# Patient Record
Sex: Male | Born: 1965 | Race: White | Hispanic: No | Marital: Single | Smoking: Current every day smoker
Health system: Southern US, Community
[De-identification: ages and names within clinical notes are randomized; demographics above are authoritative.]

## PROBLEM LIST (undated history)

## (undated) HISTORY — PX: TONSILLECTOMY: SUR1361

## (undated) HISTORY — PX: LEG SURGERY: SHX1003

---

## 2015-07-13 ENCOUNTER — Encounter (HOSPITAL_COMMUNITY): Payer: Self-pay | Admitting: *Deleted

## 2015-07-13 ENCOUNTER — Emergency Department (HOSPITAL_COMMUNITY)
Admission: EM | Admit: 2015-07-13 | Discharge: 2015-07-13 | Disposition: A | Discharge status: Home / Self Care | Payer: Self-pay | Attending: Emergency Medicine | Admitting: Emergency Medicine

## 2015-07-13 DIAGNOSIS — F17210 Nicotine dependence, cigarettes, uncomplicated: Secondary | ICD-10-CM | POA: Insufficient documentation

## 2015-07-13 DIAGNOSIS — W228XXA Striking against or struck by other objects, initial encounter: Secondary | ICD-10-CM | POA: Insufficient documentation

## 2015-07-13 DIAGNOSIS — L0390 Cellulitis, unspecified: Secondary | ICD-10-CM | POA: Insufficient documentation

## 2015-07-13 DIAGNOSIS — S8992XA Unspecified injury of left lower leg, initial encounter: Secondary | ICD-10-CM | POA: Insufficient documentation

## 2015-07-13 DIAGNOSIS — Y9389 Activity, other specified: Secondary | ICD-10-CM | POA: Insufficient documentation

## 2015-07-13 DIAGNOSIS — Y9289 Other specified places as the place of occurrence of the external cause: Secondary | ICD-10-CM | POA: Insufficient documentation

## 2015-07-13 DIAGNOSIS — Y998 Other external cause status: Secondary | ICD-10-CM | POA: Insufficient documentation

## 2015-07-13 NOTE — ED Notes (Signed)
No answer when called to patient room 

## 2015-07-13 NOTE — ED Notes (Signed)
No answer when called from waiting room

## 2015-07-13 NOTE — ED Notes (Signed)
Pt states he walked into a water spigot hitting his left shin. Area is reddened with drainage coming from the center.

## 2016-02-05 ENCOUNTER — Observation Stay (HOSPITAL_COMMUNITY)
Admission: EM | Admit: 2016-02-05 | Discharge: 2016-02-06 | Disposition: A | Discharge status: Home / Self Care | Payer: Self-pay | Attending: Internal Medicine | Admitting: Internal Medicine

## 2016-02-05 ENCOUNTER — Emergency Department (HOSPITAL_COMMUNITY): Payer: Self-pay

## 2016-02-05 ENCOUNTER — Encounter (HOSPITAL_COMMUNITY): Payer: Self-pay | Admitting: *Deleted

## 2016-02-05 DIAGNOSIS — D72829 Elevated white blood cell count, unspecified: Secondary | ICD-10-CM | POA: Diagnosis present

## 2016-02-05 DIAGNOSIS — D473 Essential (hemorrhagic) thrombocythemia: Secondary | ICD-10-CM

## 2016-02-05 DIAGNOSIS — D75839 Thrombocytosis, unspecified: Secondary | ICD-10-CM | POA: Diagnosis present

## 2016-02-05 DIAGNOSIS — F17210 Nicotine dependence, cigarettes, uncomplicated: Secondary | ICD-10-CM | POA: Insufficient documentation

## 2016-02-05 DIAGNOSIS — Z79899 Other long term (current) drug therapy: Secondary | ICD-10-CM | POA: Insufficient documentation

## 2016-02-05 DIAGNOSIS — R079 Chest pain, unspecified: Principal | ICD-10-CM | POA: Diagnosis present

## 2016-02-05 DIAGNOSIS — F17200 Nicotine dependence, unspecified, uncomplicated: Secondary | ICD-10-CM | POA: Diagnosis present

## 2016-02-05 DIAGNOSIS — F1910 Other psychoactive substance abuse, uncomplicated: Secondary | ICD-10-CM | POA: Diagnosis present

## 2016-02-05 DIAGNOSIS — R0602 Shortness of breath: Secondary | ICD-10-CM | POA: Insufficient documentation

## 2016-02-05 DIAGNOSIS — F1010 Alcohol abuse, uncomplicated: Secondary | ICD-10-CM | POA: Diagnosis present

## 2016-02-05 LAB — CBC WITH DIFFERENTIAL/PLATELET
Basophils Absolute: 0 10*3/uL (ref 0.0–0.1)
Basophils Relative: 0 %
EOS PCT: 3 %
Eosinophils Absolute: 0.4 10*3/uL (ref 0.0–0.7)
HEMATOCRIT: 48.2 % (ref 39.0–52.0)
HEMOGLOBIN: 16.4 g/dL (ref 13.0–17.0)
LYMPHS ABS: 2.5 10*3/uL (ref 0.7–4.0)
LYMPHS PCT: 23 %
MCH: 28.9 pg (ref 26.0–34.0)
MCHC: 34 g/dL (ref 30.0–36.0)
MCV: 85 fL (ref 78.0–100.0)
Monocytes Absolute: 1.2 10*3/uL — ABNORMAL HIGH (ref 0.1–1.0)
Monocytes Relative: 10 %
NEUTROS ABS: 7.1 10*3/uL (ref 1.7–7.7)
NEUTROS PCT: 64 %
Platelets: 401 10*3/uL — ABNORMAL HIGH (ref 150–400)
RBC: 5.67 MIL/uL (ref 4.22–5.81)
RDW: 12.2 % (ref 11.5–15.5)
WBC: 11.2 10*3/uL — AB (ref 4.0–10.5)

## 2016-02-05 LAB — RAPID URINE DRUG SCREEN, HOSP PERFORMED
AMPHETAMINES: POSITIVE — AB
BARBITURATES: NOT DETECTED
Benzodiazepines: NOT DETECTED
Cocaine: NOT DETECTED
Opiates: NOT DETECTED
TETRAHYDROCANNABINOL: NOT DETECTED

## 2016-02-05 LAB — BASIC METABOLIC PANEL
Anion gap: 12 (ref 5–15)
BUN: 14 mg/dL (ref 6–20)
CHLORIDE: 99 mmol/L — AB (ref 101–111)
CO2: 25 mmol/L (ref 22–32)
CREATININE: 0.9 mg/dL (ref 0.61–1.24)
Calcium: 9.8 mg/dL (ref 8.9–10.3)
GFR calc non Af Amer: >60 mL/min (ref >60)
Glucose, Bld: 79 mg/dL (ref 65–99)
POTASSIUM: 3.5 mmol/L (ref 3.5–5.1)
Sodium: 136 mmol/L (ref 135–145)

## 2016-02-05 LAB — TROPONIN I
Troponin I: <0.03 ng/mL (ref <0.03)
Troponin I: <0.03 ng/mL (ref <0.03)

## 2016-02-05 LAB — ETHANOL

## 2016-02-05 LAB — BRAIN NATRIURETIC PEPTIDE: B Natriuretic Peptide: 11 pg/mL (ref 0.0–100.0)

## 2016-02-05 MED ORDER — ENOXAPARIN SODIUM 40 MG/0.4ML ~~LOC~~ SOLN
40.0000 mg | SUBCUTANEOUS | Status: DC
  Administered 2016-02-05: 40 mg via SUBCUTANEOUS
  Filled 2016-02-05: qty 0.4

## 2016-02-05 MED ORDER — LORAZEPAM 2 MG/ML IJ SOLN
1.0000 mg | Freq: Four times a day (QID) | INTRAMUSCULAR | Status: DC | PRN
  Administered 2016-02-05: 1 mg via INTRAVENOUS
  Filled 2016-02-05: qty 1

## 2016-02-05 MED ORDER — FOLIC ACID 1 MG PO TABS
1.0000 mg | ORAL_TABLET | Freq: Every day | ORAL | Status: DC
  Administered 2016-02-05 – 2016-02-06 (×2): 1 mg via ORAL
  Filled 2016-02-05 (×2): qty 1

## 2016-02-05 MED ORDER — KETOROLAC TROMETHAMINE 30 MG/ML IJ SOLN: 30.0000 mg | Freq: Four times a day (QID) | INTRAMUSCULAR | Status: DC | PRN

## 2016-02-05 MED ORDER — NITROGLYCERIN 0.4 MG SL SUBL: 0.4000 mg | SUBLINGUAL_TABLET | SUBLINGUAL | Status: DC | PRN

## 2016-02-05 MED ORDER — LORAZEPAM 2 MG/ML IJ SOLN: 0.0000 mg | Freq: Four times a day (QID) | INTRAMUSCULAR | Status: DC

## 2016-02-05 MED ORDER — ADULT MULTIVITAMIN W/MINERALS CH
1.0000 | ORAL_TABLET | Freq: Every day | ORAL | Status: DC
  Administered 2016-02-05 – 2016-02-06 (×2): 1 via ORAL
  Filled 2016-02-05 (×2): qty 1

## 2016-02-05 MED ORDER — ONDANSETRON HCL 4 MG/2ML IJ SOLN: 4.0000 mg | Freq: Four times a day (QID) | INTRAMUSCULAR | Status: DC | PRN

## 2016-02-05 MED ORDER — SODIUM CHLORIDE 0.9 % IV BOLUS (SEPSIS)
500.0000 mL | Freq: Once | INTRAVENOUS | Status: AC
  Administered 2016-02-05: 500 mL via INTRAVENOUS

## 2016-02-05 MED ORDER — LORAZEPAM 2 MG/ML IJ SOLN: 0.0000 mg | Freq: Two times a day (BID) | INTRAMUSCULAR | Status: DC

## 2016-02-05 MED ORDER — LORAZEPAM 1 MG PO TABS: 1.0000 mg | ORAL_TABLET | Freq: Four times a day (QID) | ORAL | Status: DC | PRN

## 2016-02-05 MED ORDER — ALPRAZOLAM 0.5 MG PO TABS
0.2500 mg | ORAL_TABLET | Freq: Two times a day (BID) | ORAL | Status: DC | PRN
  Administered 2016-02-06: 0.25 mg via ORAL
  Filled 2016-02-05: qty 1

## 2016-02-05 MED ORDER — ACETAMINOPHEN 325 MG PO TABS: 650.0000 mg | ORAL_TABLET | ORAL | Status: DC | PRN

## 2016-02-05 MED ORDER — ASPIRIN 81 MG PO CHEW
324.0000 mg | CHEWABLE_TABLET | Freq: Once | ORAL | Status: AC
  Administered 2016-02-05: 324 mg via ORAL
  Filled 2016-02-05: qty 4

## 2016-02-05 MED ORDER — SODIUM CHLORIDE 0.9 % IV SOLN: INTRAVENOUS | Status: DC

## 2016-02-05 MED ORDER — VITAMIN B-1 100 MG PO TABS
100.0000 mg | ORAL_TABLET | Freq: Every day | ORAL | Status: DC
  Administered 2016-02-05 – 2016-02-06 (×2): 100 mg via ORAL
  Filled 2016-02-05 (×2): qty 1

## 2016-02-05 MED ORDER — FAMOTIDINE IN NACL 20-0.9 MG/50ML-% IV SOLN
20.0000 mg | Freq: Two times a day (BID) | INTRAVENOUS | Status: DC
  Administered 2016-02-06 (×2): 20 mg via INTRAVENOUS
  Filled 2016-02-05 (×2): qty 50

## 2016-02-05 MED ORDER — NITROGLYCERIN 0.4 MG SL SUBL
0.4000 mg | SUBLINGUAL_TABLET | SUBLINGUAL | Status: DC | PRN
  Administered 2016-02-05: 0.4 mg via SUBLINGUAL
  Filled 2016-02-05: qty 1

## 2016-02-05 MED ORDER — ASPIRIN EC 81 MG PO TBEC
81.0000 mg | DELAYED_RELEASE_TABLET | Freq: Every day | ORAL | Status: DC
  Administered 2016-02-06: 81 mg via ORAL
  Filled 2016-02-05: qty 1

## 2016-02-05 MED ORDER — THIAMINE HCL 100 MG/ML IJ SOLN: 100.0000 mg | Freq: Every day | INTRAMUSCULAR | Status: DC

## 2016-02-05 NOTE — ED Notes (Signed)
Patients blood pressure dropped to 75  Systolic post 1 nitro. Patient denies any relief fr nitro. Patient denies any symptoms. Fluid bolus infusing at this time.

## 2016-02-05 NOTE — H&P (Signed)
History and Physical    Douglas Simpson [id number] DOB: 08/20/1965 DOA: 02/05/2016  PCP: No PCP Per Patient   Patient coming from: Home   Chief Complaint: Chest pain   HPI: Douglas Simpson is a 50 y.o. male without a PCP and with medical history significant for drug and alcohol abuse who presents to the emergency department for evaluation of chest pain. Patient reports that he has had a cough for the past couple weeks which had been preceded by rhinorrhea and sore throat that he attributed to seasonal allergies as he had been cutting weeds, but had otherwise been in his usual state until the development of a squeezing chest pain with associated diaphoresis at approximately 10:30 or 11 AM on the morning of his presentation. He reports that the pain spontaneously resolved over the course of approximately 15 minutes, but later recurred. Pain was localized to the left chest, severe in intensity, squeezing in character, with no alleviating or exacerbating factors identified. There was diaphoresis reported, but no nausea or dyspnea. The patient had never experienced similar symptoms previously.  ED Course: Upon arrival to the ED, patient is found to be afebrile, saturating well on room air, and with vital signs stable. EKG demonstrates a sinus rhythm with LVH and a repolarization abnormality. Initial troponin is undetectable and chest x-ray is negative for acute cardiopulmonary disease. Chemistry panel is unremarkable and CBC is notable for a mild leukocytosis and thrombocytosis. UDS is positive for amphetamines and second troponin, drawn 2 hours later is also undetectable. The patient remained hemodynamically stable in the ED around without chest pain. He will be observed on the telemetry unit for ongoing evaluation and management of chest pain concerning for possible ACS.  Review of Systems:  All other systems reviewed and apart from HPI, are negative.  History reviewed. No pertinent past medical  history.  Past Surgical History:  Procedure Laterality Date  . LEG SURGERY    . TONSILLECTOMY       reports that he has been smoking Cigarettes.  He has been smoking about 1.00 pack per day. He has never used smokeless tobacco. He reports that he drinks about 3.6 oz of alcohol per week . He reports that he does not use drugs.  Allergies  Allergen Reactions  . Codeine Nausea Only    History reviewed. No pertinent family history.   Prior to Admission medications   Medication Sig Start Date End Date Taking? Authorizing Provider  acetaminophen (TYLENOL) 500 MG tablet Take 500 mg by mouth every 6 (six) hours as needed for mild pain or moderate pain.   Yes Historical Provider, MD  Aspirin-Acetaminophen-Caffeine (GOODY HEADACHE PO) Take 1 packet by mouth daily as needed (for pain).   Yes Historical Provider, MD  diphenhydrAMINE (BENADRYL) 25 MG tablet Take 25 mg by mouth every 6 (six) hours as needed for itching or allergies.   Yes Historical Provider, MD  famotidine (HEARTBURN RELIEF) 10 MG tablet Take 10 mg by mouth daily as needed for heartburn or indigestion.   Yes Historical Provider, MD    Physical Exam: Vitals:   02/05/16 1347 02/05/16 1350 02/05/16 1430 02/05/16 1520  BP:  106/79 107/67 93/79  Pulse:  85 82 81  Resp:  18 14 11   Temp:  97.2 F (36.2 C)    TempSrc:  Oral    SpO2:  100% 99% 95%  Weight: 95.3 kg (210 lb)     Height: 5\' 8"  (1.727 m)  Constitutional: NAD, calm, comfortable Eyes: PERTLA, lids and conjunctivae normal ENMT: Mucous membranes are moist. Posterior pharynx clear of any exudate or lesions.   Neck: normal, supple, no masses, no thyromegaly Respiratory: clear to auscultation bilaterally, no wheezing, no crackles. Normal respiratory effort.    Cardiovascular: S1 & S2 heard, regular rate and rhythm, no significant murmur. No extremity edema. No significant JVD. Abdomen: No distension, no tenderness, no masses palpated. Bowel sounds normal.    Musculoskeletal: no clubbing / cyanosis. No joint deformity upper and lower extremities. Normal muscle tone.  Skin: Erythematous plaques at chest and extremities with silvery scale. Warm, dry, well-perfused. Neurologic: CN 2-12 grossly intact. Sensation intact, DTR normal. Strength 5/5 in all 4 limbs.  Psychiatric: Normal judgment and insight. Alert and oriented x 3. Anxious, pressured speech.     Labs on Admission: I have personally reviewed following labs and imaging studies  CBC:  Recent Labs Lab 02/05/16 1417  WBC 11.2*  NEUTROABS 7.1  HGB 16.4  HCT 48.2  MCV 85.0  PLT 123XX123*   Basic Metabolic Panel:  Recent Labs Lab 02/05/16 1417  NA 136  K 3.5  CL 99*  CO2 25  GLUCOSE 79  BUN 14  CREATININE 0.90  CALCIUM 9.8   GFR: Estimated Creatinine Clearance: 107.6 mL/min (by C-G formula based on SCr of 0.9 mg/dL). Liver Function Tests: No results for input(s): AST, ALT, ALKPHOS, BILITOT, PROT, ALBUMIN in the last 168 hours. No results for input(s): LIPASE, AMYLASE in the last 168 hours. No results for input(s): AMMONIA in the last 168 hours. Coagulation Profile: No results for input(s): INR, PROTIME in the last 168 hours. Cardiac Enzymes:  Recent Labs Lab 02/05/16 1417 02/05/16 1619  TROPONINI <0.03 <0.03   BNP (last 3 results) No results for input(s): PROBNP in the last 8760 hours. HbA1C: No results for input(s): HGBA1C in the last 72 hours. CBG: No results for input(s): GLUCAP in the last 168 hours. Lipid Profile: No results for input(s): CHOL, HDL, LDLCALC, TRIG, CHOLHDL, LDLDIRECT in the last 72 hours. Thyroid Function Tests: No results for input(s): TSH, T4TOTAL, FREET4, T3FREE, THYROIDAB in the last 72 hours. Anemia Panel: No results for input(s): VITAMINB12, FOLATE, FERRITIN, TIBC, IRON, RETICCTPCT in the last 72 hours. Urine analysis: No results found for: COLORURINE, APPEARANCEUR, LABSPEC, PHURINE, GLUCOSEU, HGBUR, BILIRUBINUR, KETONESUR, PROTEINUR,  UROBILINOGEN, NITRITE, LEUKOCYTESUR Sepsis Labs: @LABRCNTIP (procalcitonin:4,lacticidven:4) )No results found for this or any previous visit (from the past 240 hour(s)).   Radiological Exams on Admission: Dg Chest Port 1 View  Result Date: 02/05/2016 CLINICAL DATA:  50 year old male with chest pain cough and shortness of breath since yesterday. Initial encounter. Smoker. EXAM: PORTABLE CHEST 1 VIEW COMPARISON:  [facility] chest and left rib series [phone]. FINDINGS: Portable AP upright view at 1413 hours. Mediastinal contours remain within normal limits. Allowing for portable technique the lungs are clear. No pneumothorax or pleural effusion. Chronic left lateral 5 through 7 and right lateral ninth rib fractures. IMPRESSION: No acute cardiopulmonary abnormality. Electronically Signed   By: Genevie Ann M.D.   On: 02/05/2016 14:40    EKG: Independently reviewed. Sinus rhythm, LVH with repolarization abnormality  Assessment/Plan  1. Chest pain  - Story is a bit concerning for ACS, but troponin has been reassuringly negative x3; EKG with non-specific ST-changes likely related to repolarization abnormality; ASA 324 mg given in ED, no beta-blocker d/t amphetamine use   - Given the report of preceding URI sxs, a viral pericarditis or myocarditis is possible,  treating with Toradol  - GI etiology also possible and treating empirically with Pepcid  - Continue to monitor on telemetry, repeat EKG in am    2. Drug and alcohol abuse  - UDS is positive for amphetamines and pt appears acutely intoxicated from this on admission  - He endorses excessive daily alcohol use  - SW consultation requested  - Monitor with CIWA and prn Ativan    3. Leukocytosis, thrombocytosis  - WBC 11,200 and platelets 401,000 on admission  - Pt is afebrile with no apparent focus of acute infection  - He reports recent rhinorrhea, sinus congestion, and sore throat  - Likely secondary to viral URI; CXR clear and pt  in no respiratory distress  - Culture if febrile   4. Current smoker - Counseled toward cessation  - Declines nicotine patch  - RN asked to provide smoking-cessation information prior to discharge     DVT prophylaxis: sq Lovenox  Code Status: Full  Family Communication: Discussed with patient Disposition Plan: Observe on telemetry Consults called: None Admission status: Observation    Vianne Bulls, MD [facility] Pager [phone]  If 7PM-7AM, please contact night-coverage [email or web address] Password [email or web address]  02/05/2016, 6:01 PM

## 2016-02-05 NOTE — ED Provider Notes (Signed)
[facility] DEPT Provider Note   CSN: [id number] Arrival date & time: 02/05/16  1341     History   Chief Complaint Chief Complaint  Patient presents with  . Chest Pain    HPI Douglas Simpson is a 50 y.o. male.   Chest Pain     Pt was seen at 1405. Per pt, c/o gradual onset and persistence of intermittent chest "pain" since approximately 1030/1100 today. Pt describes the pain as located in his left chest, "squeezing" and associated with diaphoresis. Pt states his symptoms last approximately 15 minutes before spontaneously resolving. States he "took a benadryl" without change in his symptoms. Pt also c/o "cough" for the past 1 to 2 weeks. Denies fevers, no palpitations, no SOB, no abd pain, no N/V/D, no back pain.   History reviewed. No pertinent past medical history.  There are no active problems to display for this patient.   Past Surgical History:  Procedure Laterality Date  . LEG SURGERY    . TONSILLECTOMY         Home Medications    Prior to Admission medications   Not on File    Family History History reviewed. No pertinent family history.  Social History Social History  Substance Use Topics  . Smoking status: Current Every Day Smoker    Packs/day: 1.00    Types: Cigarettes  . Smokeless tobacco: Never Used  . Alcohol use 3.6 oz/week    6 Cans of beer per week     Comment: daily     Allergies   Codeine   Review of Systems Review of Systems  Cardiovascular: Positive for chest pain.  ROS: Statement: All systems negative except as marked or noted in the HPI; Constitutional: Negative for fever and chills. ; ; Eyes: Negative for eye pain, redness and discharge. ; ; ENMT: Negative for ear pain, hoarseness, nasal congestion, sinus pressure and sore throat. ; ; Cardiovascular: +CP, diaphoresis. Negative for palpitations, dyspnea and peripheral edema. ; ; Respiratory: Negative for cough, wheezing and stridor. ; ; Gastrointestinal: Negative for nausea,  vomiting, diarrhea, abdominal pain, blood in stool, hematemesis, jaundice and rectal bleeding. . ; ; Genitourinary: Negative for dysuria, flank pain and hematuria. ; ; Musculoskeletal: Negative for back pain and neck pain. Negative for swelling and trauma.; ; Skin: Negative for pruritus, rash, abrasions, blisters, bruising and skin lesion.; ; Neuro: Negative for headache, lightheadedness and neck stiffness. Negative for weakness, altered level of consciousness, altered mental status, extremity weakness, paresthesias, involuntary movement, seizure and syncope.      Physical Exam Updated Vital Signs BP 106/79 (BP Location: Left Arm)   Pulse 85   Temp 97.2 F (36.2 C) (Oral)   Resp 18   Ht 5\' 8"  (1.727 m)   Wt 210 lb (95.3 kg)   SpO2 100%   BMI 31.93 kg/m   Physical Exam 1410: Physical examination:  Nursing notes reviewed; Vital signs and O2 SAT reviewed;  Constitutional: Well developed, Well nourished, Well hydrated, In no acute distress; Head:  Normocephalic, atraumatic; Eyes: EOMI, PERRL, No scleral icterus; ENMT: Mouth and pharynx normal, Mucous membranes moist; Neck: Supple, Full range of motion, No lymphadenopathy; Cardiovascular: Regular rate and rhythm, No gallop; Respiratory: Breath sounds clear & equal bilaterally, No wheezes.  Speaking full sentences with ease, Normal respiratory effort/excursion; Chest: Nontender, Movement normal; Abdomen: Soft, Nontender, Nondistended, Normal bowel sounds; Genitourinary: No CVA tenderness; Extremities: Pulses normal, No tenderness, No edema, No calf edema or asymmetry.; Neuro: AA&Ox3, vague historian. Major CN  grossly intact.  Speech clear. No gross focal motor or sensory deficits in extremities.; Skin: Color normal, Warm, Dry.; Psych:  Anxious.     ED Treatments / Results  Labs (all labs ordered are listed, but only abnormal results are displayed)   EKG  EKG Interpretation  Date/Time:  Friday February 05 2016 13:48:10 EDT Ventricular  Rate:  87 PR Interval:    QRS Duration: 97 QT Interval:  376 QTC Calculation: 453 R Axis:   68 Text Interpretation:  Sinus rhythm ST elev, probable normal early repol pattern No old tracing to compare Confirmed by [facility]  MD, Nunzio Cory [phone]) on 02/05/2016 2:16:52 PM        Radiology   Procedures Procedures (including critical care time)  Medications Ordered in ED Medications  sodium chloride 0.9 % bolus 500 mL (not administered)  0.9 %  sodium chloride infusion (not administered)  aspirin chewable tablet 324 mg (not administered)  nitroGLYCERIN (NITROSTAT) SL tablet 0.4 mg (not administered)     Initial Impression / Assessment and Plan / ED Course  I have reviewed the triage vital signs and the nursing notes.  Pertinent labs & imaging results that were available during my care of the patient were reviewed by me and considered in my medical decision making (see chart for details).  MDM Reviewed: nursing note and vitals Interpretation: labs, ECG and x-ray   Results for orders placed or performed during the hospital encounter of [id number]  Basic metabolic panel  Result Value Ref Range   Sodium 136 135 - 145 mmol/L   Potassium 3.5 3.5 - 5.1 mmol/L   Chloride 99 (L) 101 - 111 mmol/L   CO2 25 22 - 32 mmol/L   Glucose, Bld 79 65 - 99 mg/dL   BUN 14 6 - 20 mg/dL   Creatinine, Ser 0.90 0.61 - 1.24 mg/dL   Calcium 9.8 8.9 - 10.3 mg/dL   GFR calc non Af Amer >60 >60 mL/min   GFR calc Af Amer >60 >60 mL/min   Anion gap 12 5 - 15  Troponin I  Result Value Ref Range   Troponin I <0.03 <0.03 ng/mL  CBC with Differential  Result Value Ref Range   WBC 11.2 (H) 4.0 - 10.5 K/uL   RBC 5.67 4.22 - 5.81 MIL/uL   Hemoglobin 16.4 13.0 - 17.0 g/dL   HCT 48.2 39.0 - 52.0 %   MCV 85.0 78.0 - 100.0 fL   MCH 28.9 26.0 - 34.0 pg   MCHC 34.0 30.0 - 36.0 g/dL   RDW 12.2 11.5 - 15.5 %   Platelets 401 (H) 150 - 400 K/uL   Neutrophils Relative % 64 %   Neutro Abs 7.1 1.7 - 7.7 K/uL    Lymphocytes Relative 23 %   Lymphs Abs 2.5 0.7 - 4.0 K/uL   Monocytes Relative 10 %   Monocytes Absolute 1.2 (H) 0.1 - 1.0 K/uL   Eosinophils Relative 3 %   Eosinophils Absolute 0.4 0.0 - 0.7 K/uL   Basophils Relative 0 %   Basophils Absolute 0.0 0.0 - 0.1 K/uL  Urine rapid drug screen (hosp performed)  Result Value Ref Range   Opiates NONE DETECTED NONE DETECTED   Cocaine NONE DETECTED NONE DETECTED   Benzodiazepines NONE DETECTED NONE DETECTED   Amphetamines POSITIVE (A) NONE DETECTED   Tetrahydrocannabinol NONE DETECTED NONE DETECTED   Barbiturates NONE DETECTED NONE DETECTED  Troponin I  Result Value Ref Range   Troponin I <0.03 <0.03 ng/mL  Ethanol  Result Value Ref Range   Alcohol, Ethyl (B) <5 <5 mg/dL   Dg Chest Port 1 View Result Date: 02/05/2016 CLINICAL DATA:  50 year old male with chest pain cough and shortness of breath since yesterday. Initial encounter. Smoker. EXAM: PORTABLE CHEST 1 VIEW COMPARISON:  [facility] chest and left rib series [phone]. FINDINGS: Portable AP upright view at 1413 hours. Mediastinal contours remain within normal limits. Allowing for portable technique the lungs are clear. No pneumothorax or pleural effusion. Chronic left lateral 5 through 7 and right lateral ninth rib fractures. IMPRESSION: No acute cardiopulmonary abnormality. Electronically Signed   By: Genevie Ann M.D.   On: 02/05/2016 14:40    1630:   Pt received ASA and SL ntg; states he "feels better now." Will repeat EKG, 2nd trop. Given pt's vague/rambling complaints, will obtain UDS and etoh.  T/C to [facility]. Myna Hidalgo, case discussed, including:  HPI, pertinent PM/SHx, VS/PE, dx testing, ED course and treatment:  Agreeable to admit, requests to write temporary orders, obtain tele observation bed to team APAdmits.   Final Clinical Impressions(s) / ED Diagnoses   Final diagnoses:  None    New Prescriptions New Prescriptions   No medications on file      Francine Graven,  DO 02/07/16 2113

## 2016-02-06 DIAGNOSIS — F1910 Other psychoactive substance abuse, uncomplicated: Secondary | ICD-10-CM

## 2016-02-06 DIAGNOSIS — R079 Chest pain, unspecified: Secondary | ICD-10-CM

## 2016-02-06 DIAGNOSIS — Z720 Tobacco use: Secondary | ICD-10-CM

## 2016-02-06 DIAGNOSIS — D72829 Elevated white blood cell count, unspecified: Secondary | ICD-10-CM

## 2016-02-06 LAB — BASIC METABOLIC PANEL
Anion gap: 7 (ref 5–15)
BUN: 12 mg/dL (ref 6–20)
CALCIUM: 9.4 mg/dL (ref 8.9–10.3)
CHLORIDE: 102 mmol/L (ref 101–111)
CO2: 29 mmol/L (ref 22–32)
CREATININE: 0.88 mg/dL (ref 0.61–1.24)
GFR calc non Af Amer: >60 mL/min (ref >60)
GLUCOSE: 68 mg/dL (ref 65–99)
Potassium: 4 mmol/L (ref 3.5–5.1)
Sodium: 138 mmol/L (ref 135–145)

## 2016-02-06 LAB — CBC
HCT: 48.4 % (ref 39.0–52.0)
Hemoglobin: 16.2 g/dL (ref 13.0–17.0)
MCH: 29.2 pg (ref 26.0–34.0)
MCHC: 33.5 g/dL (ref 30.0–36.0)
MCV: 87.2 fL (ref 78.0–100.0)
PLATELETS: 366 10*3/uL (ref 150–400)
RBC: 5.55 MIL/uL (ref 4.22–5.81)
RDW: 12.4 % (ref 11.5–15.5)
WBC: 12.1 10*3/uL — ABNORMAL HIGH (ref 4.0–10.5)

## 2016-02-06 LAB — LIPID PANEL
CHOL/HDL RATIO: 4.7 ratio
CHOLESTEROL: 161 mg/dL (ref 0–200)
HDL: 34 mg/dL — AB (ref >40)
LDL Cholesterol: 105 mg/dL — ABNORMAL HIGH (ref 0–99)
TRIGLYCERIDES: 108 mg/dL (ref <150)
VLDL: 22 mg/dL (ref 0–40)

## 2016-02-06 LAB — TROPONIN I
Troponin I: <0.03 ng/mL (ref <0.03)
Troponin I: <0.03 ng/mL (ref <0.03)

## 2016-02-06 MED ORDER — ASPIRIN 81 MG PO TBEC: 81.0000 mg | DELAYED_RELEASE_TABLET | Freq: Every day | ORAL | Status: AC

## 2016-02-06 MED ORDER — PNEUMOCOCCAL VAC POLYVALENT 25 MCG/0.5ML IJ INJ: 0.5000 mL | INJECTION | INTRAMUSCULAR | Status: DC

## 2016-02-06 MED ORDER — HYDROXYZINE HCL 25 MG PO TABS: 25.0000 mg | ORAL_TABLET | Freq: Three times a day (TID) | ORAL | 0 refills | Status: AC | PRN

## 2016-02-06 MED ORDER — INFLUENZA VAC SPLIT QUAD 0.5 ML IM SUSY: 0.5000 mL | PREFILLED_SYRINGE | INTRAMUSCULAR | Status: DC

## 2016-02-06 NOTE — Progress Notes (Signed)
Went to pt's room to do routine EKG. Pt currently not in the room. Will check back shortly. RT will continue to monitor.

## 2016-02-06 NOTE — Progress Notes (Signed)
EKG done, results given to RN.  

## 2016-02-06 NOTE — Discharge Summary (Signed)
Physician Discharge Summary  Douglas Simpson [id number] DOB: [birth year] DOA: 02/05/2016  PCP: No PCP Per Patient  Admit date: 02/05/2016 Discharge date: 02/06/2016  Admitted From: Home Disposition: Home  Recommendations for Outpatient Follow-up:  1. Follow up with PCP in 1-2 weeks 2. Patient will be referred to outpatient cardiology in consideration of stress test.  Home Health: No Equipment/Devices: None  Discharge Condition: Stable CODE STATUS: Full Diet recommendation: Regular Brief/Interim Summary: 50 yom with a hx of alcohol and substance abuse and thrombocytosis presented with complaint of chest pain. While in the ED, CXR was negative and EKG did not reveal any acute changes. CBC was notable for mild thrombocytosis and leukocytosis. Troponin results were <0.03. He was admitted to telemetry for further evaluation of chest pain.  Discharge Diagnoses:  Principal Problem:   Chest pain Active Problems:   Current smoker   Leukocytosis   Thrombocytosis (HCC)   Excessive drinking of alcohol   Substance abuse  1. Chest pain. Patient has ruled out for ACS with negative cardiac markers and non-acute EKG. He has not had any further chest pain since admission. Repeat EKG from this am is unremarkable. Patient is extremely insistent on being discharged home. Will refer him to outpatient cardiology in consideration for stress test. Will continue on daily aspirin for now. Patient denies any current chest pain. Will schedule stress test. Patient was advised to return to the hospital if had any recurrence or worsening of his symptoms.  Discharge Instructions  1. If you have any continuing chest pain, please return to the hospital as soon as possible. 2. Please take 1 aspirin tablet every day.   Allergies  Allergen Reactions  . Codeine Nausea Only    Consultations:  None   Procedures/Studies: Dg Chest Port 1 View  Result Date: 02/05/2016 CLINICAL DATA:  50 year old male with  chest pain cough and shortness of breath since yesterday. Initial encounter. Smoker. EXAM: PORTABLE CHEST 1 VIEW COMPARISON:  [facility] chest and left rib series [phone]. FINDINGS: Portable AP upright view at 1413 hours. Mediastinal contours remain within normal limits. Allowing for portable technique the lungs are clear. No pneumothorax or pleural effusion. Chronic left lateral 5 through 7 and right lateral ninth rib fractures. IMPRESSION: No acute cardiopulmonary abnormality. Electronically Signed   By: Genevie Ann M.D.   On: 02/05/2016 14:40     Subjective: Denies any current chest pain, nausea, trouble breathing, and exacerbation of pain when coughing.  Discharge Exam: Vitals:   02/06/16 0000 02/06/16 0635  BP: 110/68 113/84  Pulse: 63 85  Resp: 16 20  Temp:  98 F (36.7 C)   Vitals:   02/05/16 1904 02/05/16 2100 02/06/16 0000 02/06/16 0635  BP: (!) 177/105 105/90 110/68 113/84  Pulse: 71 86 63 85  Resp: 18 16 16 20   Temp: 98 F (36.7 C) 98 F (36.7 C)  98 F (36.7 C)  TempSrc: Oral Oral  Oral  SpO2: 97% 98% 97% 98%  Weight:    86.2 kg (190 lb)  Height:        General: Pt is alert, awake, not in acute distress Cardiovascular: RRR, S1/S2 +, no rubs, no gallops Respiratory: CTA bilaterally, no wheezing, no rhonchi Abdominal: Soft, NT, ND, bowel sounds + Extremities: no edema, no cyanosis    The results of significant diagnostics from this hospitalization (including imaging, microbiology, ancillary and laboratory) are listed below for reference.     Microbiology: No results found for this or any previous visit (  from the past 240 hour(s)).   Labs: BNP (last 3 results)  Recent Labs  02/05/16 1417  BNP 0000000   Basic Metabolic Panel:  Recent Labs Lab 02/05/16 1417 02/06/16 0809  NA 136 138  K 3.5 4.0  CL 99* 102  CO2 25 29  GLUCOSE 79 68  BUN 14 12  CREATININE 0.90 0.88  CALCIUM 9.8 9.4   Liver Function Tests: No results for input(s): AST,  ALT, ALKPHOS, BILITOT, PROT, ALBUMIN in the last 168 hours. No results for input(s): LIPASE, AMYLASE in the last 168 hours. No results for input(s): AMMONIA in the last 168 hours. CBC:  Recent Labs Lab 02/05/16 1417 02/06/16 0809  WBC 11.2* 12.1*  NEUTROABS 7.1  --   HGB 16.4 16.2  HCT 48.2 48.4  MCV 85.0 87.2  PLT 401* 366   Cardiac Enzymes:  Recent Labs Lab 02/05/16 1417 02/05/16 1619 02/05/16 2008 02/06/16 0159 02/06/16 0809  TROPONINI <0.03 <0.03 <0.03 <0.03 <0.03   BNP: Invalid input(s): POCBNP CBG: No results for input(s): GLUCAP in the last 168 hours. D-Dimer No results for input(s): DDIMER in the last 72 hours. Hgb A1c No results for input(s): HGBA1C in the last 72 hours. Lipid Profile  Recent Labs  02/06/16 0809  CHOL 161  HDL 34*  LDLCALC 105*  TRIG 108  CHOLHDL 4.7   Thyroid function studies No results for input(s): TSH, T4TOTAL, T3FREE, THYROIDAB in the last 72 hours.  Invalid input(s): FREET3 Anemia work up No results for input(s): VITAMINB12, FOLATE, FERRITIN, TIBC, IRON, RETICCTPCT in the last 72 hours. Urinalysis No results found for: COLORURINE, APPEARANCEUR, LABSPEC, [place], GLUCOSEU, HGBUR, BILIRUBINUR, KETONESUR, PROTEINUR, UROBILINOGEN, NITRITE, LEUKOCYTESUR Sepsis Labs Invalid input(s): PROCALCITONIN,  WBC,  LACTICIDVEN Microbiology No results found for this or any previous visit (from the past 240 hour(s)).   Time coordinating discharge: Over 30 minutes  SIGNED:   Kathie Dike, MD [facility] 02/06/2016, 11:49 AM Pager  If 7PM-7AM, please contact night-coverage [email or web address] Password [email or web address]  By signing my name below, I, Clerance Lav, attest that this documentation has been prepared under the direction and in the presence of Kathie Dike, MD. Electronically signed: Clerance Lav, Scribe. 02/06/16 2:25 PM  I, Dr. Kathie Dike, personally performed the services described in this documentaiton. All medical record  entries made by the scribe were at my direction and in my presence. I have reviewed the chart and agree that the record reflects my personal performance and is accurate and complete  Kathie Dike, MD, 02/06/2016 2:45 PM

## 2016-02-06 NOTE — Progress Notes (Signed)
Instructions given on medications,and follow up visits,patient verbalized understanding. Prescription sent with patient,and the other to be picked up from Pharmacy of choice documented on AVS.

## 2016-03-14 ENCOUNTER — Encounter: Payer: Self-pay | Admitting: Cardiology

## 2020-02-03 ENCOUNTER — Inpatient Hospital Stay (HOSPITAL_COMMUNITY)
Admission: EM | Admit: 2020-02-03 | Discharge: 2020-02-05 | DRG: 193 | Disposition: A | Discharge status: Home / Self Care | Payer: Self-pay | Attending: Internal Medicine | Admitting: Internal Medicine

## 2020-02-03 ENCOUNTER — Encounter (HOSPITAL_COMMUNITY): Payer: Self-pay | Admitting: Emergency Medicine

## 2020-02-03 ENCOUNTER — Emergency Department (HOSPITAL_COMMUNITY): Payer: Self-pay

## 2020-02-03 ENCOUNTER — Other Ambulatory Visit: Payer: Self-pay

## 2020-02-03 DIAGNOSIS — R0902 Hypoxemia: Secondary | ICD-10-CM

## 2020-02-03 DIAGNOSIS — R0602 Shortness of breath: Secondary | ICD-10-CM

## 2020-02-03 DIAGNOSIS — Z7982 Long term (current) use of aspirin: Secondary | ICD-10-CM

## 2020-02-03 DIAGNOSIS — I77810 Thoracic aortic ectasia: Secondary | ICD-10-CM | POA: Diagnosis present

## 2020-02-03 DIAGNOSIS — F17210 Nicotine dependence, cigarettes, uncomplicated: Secondary | ICD-10-CM | POA: Diagnosis present

## 2020-02-03 DIAGNOSIS — Z79899 Other long term (current) drug therapy: Secondary | ICD-10-CM

## 2020-02-03 DIAGNOSIS — J40 Bronchitis, not specified as acute or chronic: Secondary | ICD-10-CM | POA: Diagnosis present

## 2020-02-03 DIAGNOSIS — Z20822 Contact with and (suspected) exposure to covid-19: Secondary | ICD-10-CM | POA: Diagnosis present

## 2020-02-03 DIAGNOSIS — Z885 Allergy status to narcotic agent status: Secondary | ICD-10-CM

## 2020-02-03 DIAGNOSIS — B348 Other viral infections of unspecified site: Secondary | ICD-10-CM | POA: Diagnosis present

## 2020-02-03 DIAGNOSIS — J9601 Acute respiratory failure with hypoxia: Secondary | ICD-10-CM | POA: Diagnosis present

## 2020-02-03 DIAGNOSIS — J189 Pneumonia, unspecified organism: Principal | ICD-10-CM | POA: Diagnosis present

## 2020-02-03 LAB — SARS CORONAVIRUS 2 BY RT PCR (HOSPITAL ORDER, PERFORMED IN ~~LOC~~ HOSPITAL LAB): SARS Coronavirus 2: NEGATIVE

## 2020-02-03 LAB — CBC WITH DIFFERENTIAL/PLATELET
Abs Immature Granulocytes: 0.18 10*3/uL — ABNORMAL HIGH (ref 0.00–0.07)
Basophils Absolute: 0.1 10*3/uL (ref 0.0–0.1)
Basophils Relative: 1 %
Eosinophils Absolute: 0.2 10*3/uL (ref 0.0–0.5)
Eosinophils Relative: 1 %
HCT: 44.8 % (ref 39.0–52.0)
Hemoglobin: 14.1 g/dL (ref 13.0–17.0)
Immature Granulocytes: 1 %
Lymphocytes Relative: 17 %
Lymphs Abs: 3 10*3/uL (ref 0.7–4.0)
MCH: 28.6 pg (ref 26.0–34.0)
MCHC: 31.5 g/dL (ref 30.0–36.0)
MCV: 90.9 fL (ref 80.0–100.0)
Monocytes Absolute: 1.9 10*3/uL — ABNORMAL HIGH (ref 0.1–1.0)
Monocytes Relative: 11 %
Neutro Abs: 12.4 10*3/uL — ABNORMAL HIGH (ref 1.7–7.7)
Neutrophils Relative %: 69 %
Platelets: 334 10*3/uL (ref 150–400)
RBC: 4.93 MIL/uL (ref 4.22–5.81)
RDW: 13 % (ref 11.5–15.5)
WBC: 17.7 10*3/uL — ABNORMAL HIGH (ref 4.0–10.5)
nRBC: 0 % (ref 0.0–0.2)

## 2020-02-03 LAB — COMPREHENSIVE METABOLIC PANEL
ALT: 23 U/L (ref 0–44)
AST: 14 U/L — ABNORMAL LOW (ref 15–41)
Albumin: 3.9 g/dL (ref 3.5–5.0)
Alkaline Phosphatase: 63 U/L (ref 38–126)
Anion gap: 10 (ref 5–15)
BUN: 10 mg/dL (ref 6–20)
CO2: 28 mmol/L (ref 22–32)
Calcium: 8.8 mg/dL — ABNORMAL LOW (ref 8.9–10.3)
Chloride: 97 mmol/L — ABNORMAL LOW (ref 98–111)
Creatinine, Ser: 0.76 mg/dL (ref 0.61–1.24)
GFR calc Af Amer: >60 mL/min (ref >60)
GFR calc non Af Amer: >60 mL/min (ref >60)
Glucose, Bld: 72 mg/dL (ref 70–99)
Potassium: 3.7 mmol/L (ref 3.5–5.1)
Sodium: 135 mmol/L (ref 135–145)
Total Bilirubin: 0.7 mg/dL (ref 0.3–1.2)
Total Protein: 7.6 g/dL (ref 6.5–8.1)

## 2020-02-03 LAB — D-DIMER, QUANTITATIVE: D-Dimer, Quant: 0.46 ug/mL-FEU (ref 0.00–0.50)

## 2020-02-03 LAB — TRIGLYCERIDES: Triglycerides: 167 mg/dL — ABNORMAL HIGH (ref <150)

## 2020-02-03 LAB — FIBRINOGEN: Fibrinogen: >800 mg/dL — ABNORMAL HIGH (ref 210–475)

## 2020-02-03 LAB — PROCALCITONIN: Procalcitonin: <0.1 ng/mL

## 2020-02-03 LAB — C-REACTIVE PROTEIN: CRP: 13.5 mg/dL — ABNORMAL HIGH (ref <1.0)

## 2020-02-03 LAB — FERRITIN: Ferritin: 163 ng/mL (ref 24–336)

## 2020-02-03 LAB — LACTIC ACID, PLASMA: Lactic Acid, Venous: 1.2 mmol/L (ref 0.5–1.9)

## 2020-02-03 LAB — CBG MONITORING, ED: Glucose-Capillary: 115 mg/dL — ABNORMAL HIGH (ref 70–99)

## 2020-02-03 LAB — LACTATE DEHYDROGENASE: LDH: 138 U/L (ref 98–192)

## 2020-02-03 MED ORDER — AEROCHAMBER Z-STAT PLUS/MEDIUM MISC
1.0000 | Freq: Once | Status: AC
  Administered 2020-02-03: 1

## 2020-02-03 MED ORDER — ALBUTEROL SULFATE HFA 108 (90 BASE) MCG/ACT IN AERS
4.0000 | INHALATION_SPRAY | Freq: Once | RESPIRATORY_TRACT | Status: AC
  Administered 2020-02-03: 4 via RESPIRATORY_TRACT
  Filled 2020-02-03: qty 6.7

## 2020-02-03 MED ORDER — IOHEXOL 350 MG/ML SOLN
100.0000 mL | Freq: Once | INTRAVENOUS | Status: AC | PRN
  Administered 2020-02-03: 100 mL via INTRAVENOUS

## 2020-02-03 MED ORDER — DEXAMETHASONE SODIUM PHOSPHATE 10 MG/ML IJ SOLN
6.0000 mg | Freq: Once | INTRAMUSCULAR | Status: AC
  Administered 2020-02-03: 6 mg via INTRAVENOUS
  Filled 2020-02-03: qty 1

## 2020-02-03 MED ORDER — ALBUTEROL SULFATE (2.5 MG/3ML) 0.083% IN NEBU
5.0000 mg | INHALATION_SOLUTION | Freq: Once | RESPIRATORY_TRACT | Status: AC
  Administered 2020-02-03: 5 mg via RESPIRATORY_TRACT
  Filled 2020-02-03: qty 6

## 2020-02-03 MED ORDER — SODIUM CHLORIDE 0.9 % IV SOLN
1.0000 g | Freq: Once | INTRAVENOUS | Status: AC
  Administered 2020-02-03: 1 g via INTRAVENOUS
  Filled 2020-02-03: qty 10

## 2020-02-03 MED ORDER — SODIUM CHLORIDE 0.9 % IV SOLN
500.0000 mg | INTRAVENOUS | Status: DC
  Administered 2020-02-03 – 2020-02-04 (×2): 500 mg via INTRAVENOUS
  Filled 2020-02-03 (×2): qty 500

## 2020-02-03 NOTE — ED Triage Notes (Signed)
Patient is having shortness of breath for the last 2 weeks with productive cough, coughing up green colored phlegm, audible wheezing, O2 sats 91% in triage.

## 2020-02-03 NOTE — ED Provider Notes (Signed)
[facility] EMERGENCY DEPARTMENT Provider Note   CSN: [id number] Arrival date & time: 02/03/20  1659     History Chief Complaint  Patient presents with  . Shortness of Breath    Douglas Simpson is a 54 y.o. male with a significant smoking and etoh hx, presenting with a 2 week history of shortness of breath and cough productive of green sputum, wheezing and increased generalized fatigue.  He also reports nasal congestion along with reduced sense of taste and smell, denies chest pain but does have bilateral chest soreness suspected from frequency of cough. No fevers or chills, endorses generalized fatigue.  He states he was feeling better over the past several days, then his symptoms worsened yesterday. He has not been covid tested or vaccinated.  He had a possible exposure to Covid at a funeral home last week.  He has had no treatment prior to arrival.   HPI     History reviewed. No pertinent past medical history.  Patient Active Problem List   Diagnosis Date Noted  . Chest pain 02/05/2016  . Current smoker 02/05/2016  . Leukocytosis 02/05/2016  . Thrombocytosis ([place]) 02/05/2016  . Excessive drinking of alcohol 02/05/2016  . Substance abuse ([place]) 02/05/2016    Past Surgical History:  Procedure Laterality Date  . LEG SURGERY    . TONSILLECTOMY         No family history on file.  Social History   Tobacco Use  . Smoking status: Current Every Day Smoker    Packs/day: 1.00    Types: Cigarettes  . Smokeless tobacco: Never Used  Substance Use Topics  . Alcohol use: Yes    Alcohol/week: 6.0 standard drinks    Types: 6 Cans of beer per week    Comment: daily  . Drug use: No    Home Medications Prior to Admission medications   Medication Sig Start Date End Date Taking? Authorizing Provider  acetaminophen (TYLENOL) 500 MG tablet Take 500 mg by mouth every 6 (six) hours as needed for mild pain or moderate pain.    [provider]  aspirin EC 81 MG EC tablet  Take 1 tablet (81 mg total) by mouth daily. 02/07/16   Kathie Dike, MD  Aspirin-Acetaminophen-Caffeine (GOODY HEADACHE PO) Take 1 packet by mouth daily as needed (for pain).    [provider]  diphenhydrAMINE (BENADRYL) 25 MG tablet Take 25 mg by mouth every 6 (six) hours as needed for itching or allergies.    [provider]  famotidine (HEARTBURN RELIEF) 10 MG tablet Take 10 mg by mouth daily as needed for heartburn or indigestion.    [provider]  hydrOXYzine (ATARAX/VISTARIL) 25 MG tablet Take 1 tablet (25 mg total) by mouth 3 (three) times daily as needed for anxiety. 02/06/16   Kathie Dike, MD    Allergies    Codeine  Review of Systems   Review of Systems  Constitutional: Negative for chills and fever.  HENT: Positive for congestion. Negative for ear pain, rhinorrhea, sinus pressure, sore throat, trouble swallowing and voice change.   Eyes: Negative for discharge.  Respiratory: Positive for cough, shortness of breath and wheezing. Negative for stridor.   Cardiovascular: Negative for chest pain.  Gastrointestinal: Negative for abdominal pain.  Genitourinary: Negative.     Physical Exam Updated Vital Signs BP (!) 142/90 (BP Location: Right Arm)   Pulse 95   Temp 98.2 F (36.8 C) (Oral)   Resp (!) 24   Ht 5\' 8"  (1.727  m)   Wt 95.3 kg   SpO2 90%   BMI 31.93 kg/m   Physical Exam Vitals and nursing note reviewed.  Constitutional:      Appearance: He is well-developed.  HENT:     Head: Normocephalic and atraumatic.  Eyes:     Conjunctiva/sclera: Conjunctivae normal.  Cardiovascular:     Rate and Rhythm: Normal rate and regular rhythm.     Heart sounds: Normal heart sounds.  Pulmonary:     Effort: Pulmonary effort is normal. Tachypnea present.     Breath sounds: No stridor. Decreased breath sounds and wheezing present. No rhonchi.     Comments: Expiratory wheeze throughout lung bases with prolonged expirations.  Hypoxia 90% on room  air.  Chest:     Chest wall: No tenderness.  Abdominal:     General: Bowel sounds are normal.     Palpations: Abdomen is soft.     Tenderness: There is no abdominal tenderness.  Musculoskeletal:        General: Normal range of motion.     Cervical back: Normal range of motion.     Right lower leg: No edema.     Left lower leg: No edema.  Skin:    General: Skin is warm and dry.  Neurological:     Mental Status: He is alert.     ED Results / Procedures / Treatments   Labs (all labs ordered are listed, but only abnormal results are displayed) Labs Reviewed  SARS CORONAVIRUS 2 BY RT PCR ([facility])  CULTURE, BLOOD (ROUTINE X 2)  CULTURE, BLOOD (ROUTINE X 2)  LACTIC ACID, PLASMA  LACTIC ACID, PLASMA  CBC WITH DIFFERENTIAL/PLATELET  COMPREHENSIVE METABOLIC PANEL  D-DIMER, QUANTITATIVE (NOT AT [facility])  PROCALCITONIN  LACTATE DEHYDROGENASE  FERRITIN  TRIGLYCERIDES  FIBRINOGEN  C-REACTIVE PROTEIN    EKG EKG Interpretation  Date/Time:  Monday February 03 2020 18:01:15 EDT Ventricular Rate:  93 PR Interval:    QRS Duration: 88 QT Interval:  340 QTC Calculation: 423 R Axis:   63 Text Interpretation: Sinus rhythm No STEMI Confirmed by Nanda Quinton [id number]) on 02/03/2020 6:11:53 PM   Radiology DG Chest Port 1 View  Result Date: 02/03/2020 CLINICAL DATA:  Short of breath for 2 weeks, productive cough EXAM: PORTABLE CHEST 1 VIEW COMPARISON:  02/05/2016 FINDINGS: Single frontal view of the chest demonstrates an unremarkable cardiac silhouette. No acute airspace disease, effusion, or pneumothorax. No acute bony abnormalities. IMPRESSION: 1. No acute intrathoracic process. Electronically Signed   By: Randa Ngo M.D.   On: 02/03/2020 18:30    Procedures Procedures (including critical care time)  Medications Ordered in ED Medications  albuterol (VENTOLIN HFA) 108 (90 Base) MCG/ACT inhaler 4 puff (has no administration in time  range)  aerochamber Z-Stat Plus/medium 1 each (has no administration in time range)  dexamethasone (DECADRON) injection 6 mg (has no administration in time range)    ED Course  I have reviewed the triage vital signs and the nursing notes.  Pertinent labs & imaging results that were available during my care of the patient were reviewed by me and considered in my medical decision making (see chart for details).    MDM Rules/Calculators/A&P                          Pt with sob, wheezing, possible Covid 19 infection.  Albuterol mdi ordered, decadron IV given. Pending labs including Covid 19  swab. Suspect may need additional neb tx if covid negative. Also may need to consider hospitalization if not improved with tx and Covid +, especially given hypoxia to 90%.    Pt signed out to [employer], PA-C for ongoing management and disposition.    Final Clinical Impression(s) / ED Diagnoses Final diagnoses:  SOB (shortness of breath)    Rx / DC Orders ED Discharge Orders    None       Landis Martins 02/03/20 Standley Dakins, MD 02/04/20 [phone]

## 2020-02-03 NOTE — ED Provider Notes (Signed)
Care assumed from J. Idol PA-C at shift change pending work up.  See her note for full H&P.   Briefly this is a 54 year old male presenting with shortness of breath x 2 weeks. Concern for covid as he had recent exposure. Suspect covid admission labs ordered. Patient noted to be hypoxic to 90% on RA, tachypnic, wheezing. Chest xray negative. Ordered decadron, MDI albuterol. EKG shows sinus rhythm.   Physical Exam  BP (!) 142/90 (BP Location: Right Arm)    Pulse 95    Temp 98.2 F (36.8 C) (Oral)    Resp (!) 24    Ht 5\' 8"  (1.727 m)    Wt 95.3 kg    SpO2 90%    BMI 31.93 kg/m   Physical Exam PE: Constitutional: well-developed, well-nourished HENT: normocephalic, atraumatic. no cervical adenopathy Cardiovascular: normal rate and rhythm, distal pulses intact Pulmonary/Chest: oxygen saturation 93% on 3L [place]  Tachypneic.  Wheezing heard in all lung fields. Abdominal: soft and nontender Musculoskeletal: full ROM, no edema Neurological: alert with goal directed thinking Skin: warm and dry, no rash, no diaphoresis Psychiatric: normal mood and affect, normal behavior   ED Course/Procedures     Results for orders placed or performed during the hospital encounter of 02/03/20 (from the past 24 hour(s))  SARS Coronavirus 2 by RT PCR (hospital order, performed in [place] hospital lab) Nasopharyngeal Nasopharyngeal Swab     Status: None   Collection Time: 02/03/20  6:22 PM   Specimen: Nasopharyngeal Swab  Result Value Ref Range   SARS Coronavirus 2 NEGATIVE NEGATIVE  Lactic acid, plasma     Status: None   Collection Time: 02/03/20  6:22 PM  Result Value Ref Range   Lactic Acid, Venous 1.2 0.5 - 1.9 mmol/L  Blood Culture (routine x 2)     Status: None (Preliminary result)   Collection Time: 02/03/20  6:22 PM   Specimen: Left Antecubital; Blood  Result Value Ref Range   Specimen Description LEFT ANTECUBITAL    Special Requests      BOTTLES DRAWN AEROBIC AND ANAEROBIC Blood Culture adequate  volume Performed at [facility], [address] [place], [place] [address]    Culture PENDING    Report Status PENDING   CBC WITH DIFFERENTIAL     Status: Abnormal   Collection Time: 02/03/20  6:22 PM  Result Value Ref Range   WBC 17.7 (H) 4.0 - 10.5 K/uL   RBC 4.93 4.22 - 5.81 MIL/uL   Hemoglobin 14.1 13.0 - 17.0 g/dL   HCT 44.8 39 - 52 %   MCV 90.9 80.0 - 100.0 fL   MCH 28.6 26.0 - 34.0 pg   MCHC 31.5 30.0 - 36.0 g/dL   RDW 13.0 11.5 - 15.5 %   Platelets 334 150 - 400 K/uL   nRBC 0.0 0.0 - 0.2 %   Neutrophils Relative % 69 %   Neutro Abs 12.4 (H) 1.7 - 7.7 K/uL   Lymphocytes Relative 17 %   Lymphs Abs 3.0 0.7 - 4.0 K/uL   Monocytes Relative 11 %   Monocytes Absolute 1.9 (H) 0 - 1 K/uL   Eosinophils Relative 1 %   Eosinophils Absolute 0.2 0 - 0 K/uL   Basophils Relative 1 %   Basophils Absolute 0.1 0 - 0 K/uL   Immature Granulocytes 1 %   Abs Immature Granulocytes 0.18 (H) 0.00 - 0.07 K/uL  Comprehensive metabolic panel     Status: Abnormal   Collection Time: 02/03/20  6:22  PM  Result Value Ref Range   Sodium 135 135 - 145 mmol/L   Potassium 3.7 3.5 - 5.1 mmol/L   Chloride 97 (L) 98 - 111 mmol/L   CO2 28 22 - 32 mmol/L   Glucose, Bld 72 70 - 99 mg/dL   BUN 10 6 - 20 mg/dL   Creatinine, Ser 0.76 0.61 - 1.24 mg/dL   Calcium 8.8 (L) 8.9 - 10.3 mg/dL   Total Protein 7.6 6.5 - 8.1 g/dL   Albumin 3.9 3.5 - 5.0 g/dL   AST 14 (L) 15 - 41 U/L   ALT 23 0 - 44 U/L   Alkaline Phosphatase 63 38 - 126 U/L   Total Bilirubin 0.7 0.3 - 1.2 mg/dL   GFR calc non Af Amer >60 >60 mL/min   GFR calc Af Amer >60 >60 mL/min   Anion gap 10 5 - 15  D-dimer, quantitative     Status: None   Collection Time: 02/03/20  6:22 PM  Result Value Ref Range   D-Dimer, Quant 0.46 0.00 - 0.50 ug/mL-FEU  Procalcitonin     Status: None   Collection Time: 02/03/20  6:22 PM  Result Value Ref Range   Procalcitonin <0.10 ng/mL  Lactate dehydrogenase     Status: None   Collection Time: 02/03/20  6:22  PM  Result Value Ref Range   LDH 138 98 - 192 U/L  Ferritin     Status: None   Collection Time: 02/03/20  6:22 PM  Result Value Ref Range   Ferritin 163 24 - 336 ng/mL  Triglycerides     Status: Abnormal   Collection Time: 02/03/20  6:22 PM  Result Value Ref Range   Triglycerides 167 (H) <150 mg/dL  Fibrinogen     Status: Abnormal   Collection Time: 02/03/20  6:22 PM  Result Value Ref Range   Fibrinogen >800 (H) 210 - 475 mg/dL  C-reactive protein     Status: Abnormal   Collection Time: 02/03/20  6:22 PM  Result Value Ref Range   CRP 13.5 (H) <1.0 mg/dL  Blood Culture (routine x 2)     Status: None (Preliminary result)   Collection Time: 02/03/20  6:27 PM   Specimen: Right Antecubital; Blood  Result Value Ref Range   Specimen Description RIGHT ANTECUBITAL    Special Requests      BOTTLES DRAWN AEROBIC AND ANAEROBIC Blood Culture adequate volume Performed at [facility], [address] [place], [place] [address]    Culture PENDING    Report Status PENDING   POC CBG, ED     Status: Abnormal   Collection Time: 02/03/20  8:52 PM  Result Value Ref Range   Glucose-Capillary 115 (H) 70 - 99 mg/dL     CT Angio Chest PE W and/or Wo Contrast  Result Date: 02/03/2020 CLINICAL DATA:  Shortness of breath for 2 weeks with productive cough EXAM: CT ANGIOGRAPHY CHEST WITH CONTRAST TECHNIQUE: Multidetector CT imaging of the chest was performed using the standard protocol during bolus administration of intravenous contrast. Multiplanar CT image reconstructions and MIPs were obtained to evaluate the vascular anatomy. CONTRAST:  161mL OMNIPAQUE IOHEXOL 350 MG/ML SOLN COMPARISON:  Chest x-ray from earlier in the same day. FINDINGS: Cardiovascular: Thoracic aorta demonstrates a normal branching pattern. Mild dilatation of the ascending aorta to 4.1 cm is noted. No dissection is seen. No cardiac enlargement is noted. The pulmonary artery is well visualized with a normal branching pattern. No  definitive filling defect is  identified to suggest pulmonary embolism. Mediastinum/Nodes: The thoracic inlet is within normal limits. Scattered small mediastinal lymph nodes are noted although not significant by size criteria. Some of these are calcified consistent with prior granulomatous disease. There are hilar lymph nodes identified bilaterally right slightly greater than left also with some degree of calcification consistent with prior granulomatous disease. The esophagus as visualized is within normal limits. Lungs/Pleura: The lungs are well aerated bilaterally. Diffuse tree-in-bud opacities are noted particularly in the upper lobes bilaterally consistent with atypical pneumonia. These changes could be related to underlying sarcoid given granulomatous changes. No sizable effusion is noted. No confluent infiltrate is seen. Upper Abdomen: Multiple calcified splenic granulomas are noted. The remainder of the upper abdomen appears within normal limits. Musculoskeletal: Degenerative changes of the thoracic spine without acute bony abnormality. Review of the MIP images confirms the above findings. IMPRESSION: Changes consistent with prior granulomatous disease as described. Tree-in-bud opacities primarily within the upper lobes bilaterally consistent with atypical pneumonia. This could represent underlying sarcoid. The need for further workup can be determined on a clinical basis. No evidence of pulmonary emboli. 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 Recommend annual imaging followup by CTA or MRA. This recommendation follows 2010 ACCF/AHA/AATS/ACR/ASA/SCA/SCAI/SIR/STS/SVM Guidelines for the Diagnosis and Management of Patients with Thoracic Aortic Disease. Circulation. 2010; 121: O962-X528. Aortic aneurysm NOS (ICD10-I71.9) Aortic Atherosclerosis (ICD10-I70.0). Electronically Signed   By: Inez Catalina M.D.   On: 02/03/2020 22:05   DG Chest Port 1 View  Result Date: 02/03/2020 CLINICAL DATA:  Short of  breath for 2 weeks, productive cough EXAM: PORTABLE CHEST 1 VIEW COMPARISON:  02/05/2016 FINDINGS: Single frontal view of the chest demonstrates an unremarkable cardiac silhouette. No acute airspace disease, effusion, or pneumothorax. No acute bony abnormalities. IMPRESSION: 1. No acute intrathoracic process. Electronically Signed   By: Randa Ngo M.D.   On: 02/03/2020 18:30    EKG Interpretation  Date/Time:  Monday February 03 2020 18:01:15 EDT Ventricular Rate:  93 PR Interval:    QRS Duration: 88 QT Interval:  340 QTC Calculation: 423 R Axis:   63 Text Interpretation: Sinus rhythm No STEMI Confirmed by Nanda Quinton [phone]) on 02/03/2020 6:11:53 PM       .Critical Care Performed by: Cherre Robins, PA-C Authorized by: Cherre Robins, PA-C   Critical care provider statement:    Critical care time (minutes):  32   Critical care time was exclusive of:  Teaching time and separately billable procedures and treating other patients   Critical care was necessary to treat or prevent imminent or life-threatening deterioration of the following conditions:  Respiratory failure   Critical care was time spent personally by me on the following activities:  Development of treatment plan with patient or surrogate, evaluation of patient's response to treatment, examination of patient, ordering and review of laboratory studies, ordering and review of radiographic studies, pulse oximetry, re-evaluation of patient's condition and review of old charts   I assumed direction of critical care for this patient from another provider in my specialty: yes       MDM  Patient received in signout.  Please see previous provider note to include MDM up to this point.  Hypoxic to 90, now on 3L [place] with oxygen saturatin of 93%.  Labs show leukocytosis of 17.7, anemia.  Lactic acid is within normal range.  CMP without significant electrolyte derangement, no renal insufficiency.  Was noted to have glucose  of 72.  Patient was given IV Decadron.  CBG rechecked and is 115.   Inflammatory markers are elevated. Covid test is negative. Lactic acid within normal range. D-dimer is normal. Given new hypoxia and leukocytosis CT A/P ordered and shows tree-in-bud opacities primarily within the upper lobes bilaterally consistent with atypical pneumonia. Radiologist also comments it could represent underlying sarcoid. The need for further workup can be determined on a clinical basis. Patient also has dilatation of the ascending aorta to 4.1 cm. Therefore annual imaging followup by CTA or MRA. I informed patient of findings and recommendations. Antibiotics ordered for pneumonia.   On reassessment he is still wheezing after MDI. Will order duoneb.  ED attending discussed case with hospitalist service who agrees to assume care of patient and bring into the hospital for further evaluation and management.     Douglas Simpson was evaluated in Emergency Department on 02/03/2020 for the symptoms described in the history of present illness. He was evaluated in the context of the global COVID-19 pandemic, which necessitated consideration that the patient might be at risk for infection with the SARS-CoV-2 virus that causes COVID-19. Institutional protocols and algorithms that pertain to the evaluation of patients at risk for COVID-19 are in a state of rapid change based on information released by regulatory bodies including the CDC and federal and state organizations. These policies and algorithms were followed during the patient's care in the ED.   Portions of this note were generated with [employer]. Dictation errors may occur despite best attempts at proofreading.   DX: atypical pneumonia, hypoxia, dilated ascending aorta    Cherre Robins, PA-C 02/03/20 [id number]    Margette Fast, MD 02/04/20 [phone]

## 2020-02-04 DIAGNOSIS — R0602 Shortness of breath: Secondary | ICD-10-CM

## 2020-02-04 DIAGNOSIS — R0902 Hypoxemia: Secondary | ICD-10-CM

## 2020-02-04 DIAGNOSIS — J189 Pneumonia, unspecified organism: Secondary | ICD-10-CM | POA: Diagnosis present

## 2020-02-04 LAB — CBC WITH DIFFERENTIAL/PLATELET
Abs Immature Granulocytes: 0.13 10*3/uL — ABNORMAL HIGH (ref 0.00–0.07)
Basophils Absolute: 0 10*3/uL (ref 0.0–0.1)
Basophils Relative: 0 %
Eosinophils Absolute: 0 10*3/uL (ref 0.0–0.5)
Eosinophils Relative: 0 %
HCT: 44.3 % (ref 39.0–52.0)
Hemoglobin: 14.2 g/dL (ref 13.0–17.0)
Immature Granulocytes: 1 %
Lymphocytes Relative: 9 %
Lymphs Abs: 1.2 10*3/uL (ref 0.7–4.0)
MCH: 29.1 pg (ref 26.0–34.0)
MCHC: 32.1 g/dL (ref 30.0–36.0)
MCV: 90.8 fL (ref 80.0–100.0)
Monocytes Absolute: 0.4 10*3/uL (ref 0.1–1.0)
Monocytes Relative: 3 %
Neutro Abs: 11.7 10*3/uL — ABNORMAL HIGH (ref 1.7–7.7)
Neutrophils Relative %: 87 %
Platelets: 329 10*3/uL (ref 150–400)
RBC: 4.88 MIL/uL (ref 4.22–5.81)
RDW: 13 % (ref 11.5–15.5)
WBC: 13.4 10*3/uL — ABNORMAL HIGH (ref 4.0–10.5)
nRBC: 0 % (ref 0.0–0.2)

## 2020-02-04 LAB — COMPREHENSIVE METABOLIC PANEL
ALT: 21 U/L (ref 0–44)
AST: 13 U/L — ABNORMAL LOW (ref 15–41)
Albumin: 3.7 g/dL (ref 3.5–5.0)
Alkaline Phosphatase: 64 U/L (ref 38–126)
Anion gap: 9 (ref 5–15)
BUN: 13 mg/dL (ref 6–20)
CO2: 27 mmol/L (ref 22–32)
Calcium: 8.9 mg/dL (ref 8.9–10.3)
Chloride: 99 mmol/L (ref 98–111)
Creatinine, Ser: 0.73 mg/dL (ref 0.61–1.24)
GFR calc Af Amer: >60 mL/min (ref >60)
GFR calc non Af Amer: >60 mL/min (ref >60)
Glucose, Bld: 205 mg/dL — ABNORMAL HIGH (ref 70–99)
Potassium: 4.3 mmol/L (ref 3.5–5.1)
Sodium: 135 mmol/L (ref 135–145)
Total Bilirubin: 0.7 mg/dL (ref 0.3–1.2)
Total Protein: 7.5 g/dL (ref 6.5–8.1)

## 2020-02-04 LAB — HIV ANTIBODY (ROUTINE TESTING W REFLEX): HIV Screen 4th Generation wRfx: NONREACTIVE

## 2020-02-04 LAB — RESPIRATORY PANEL BY PCR

## 2020-02-04 LAB — MAGNESIUM: Magnesium: 2.4 mg/dL (ref 1.7–2.4)

## 2020-02-04 MED ORDER — HYDROXYZINE HCL 25 MG PO TABS
25.0000 mg | ORAL_TABLET | Freq: Three times a day (TID) | ORAL | Status: DC | PRN
  Administered 2020-02-05: 25 mg via ORAL
  Filled 2020-02-04: qty 1

## 2020-02-04 MED ORDER — METHYLPREDNISOLONE SODIUM SUCC 125 MG IJ SOLR
60.0000 mg | Freq: Two times a day (BID) | INTRAMUSCULAR | Status: DC
  Administered 2020-02-04 – 2020-02-05 (×3): 60 mg via INTRAVENOUS
  Filled 2020-02-04 (×3): qty 2

## 2020-02-04 MED ORDER — IPRATROPIUM-ALBUTEROL 0.5-2.5 (3) MG/3ML IN SOLN
3.0000 mL | Freq: Four times a day (QID) | RESPIRATORY_TRACT | Status: DC
  Administered 2020-02-04 – 2020-02-05 (×3): 3 mL via RESPIRATORY_TRACT
  Filled 2020-02-04 (×4): qty 3

## 2020-02-04 MED ORDER — SODIUM CHLORIDE 0.9 % IV SOLN
1.0000 g | INTRAVENOUS | Status: DC
  Administered 2020-02-04: 1 g via INTRAVENOUS
  Filled 2020-02-04: qty 10

## 2020-02-04 MED ORDER — ASPIRIN EC 81 MG PO TBEC
81.0000 mg | DELAYED_RELEASE_TABLET | Freq: Every day | ORAL | Status: DC
  Administered 2020-02-04: 81 mg via ORAL
  Filled 2020-02-04 (×2): qty 1

## 2020-02-04 MED ORDER — FAMOTIDINE 20 MG PO TABS: 10.0000 mg | ORAL_TABLET | Freq: Every day | ORAL | Status: DC | PRN

## 2020-02-04 MED ORDER — SODIUM CHLORIDE 0.9 % IV SOLN: 500.0000 mg | INTRAVENOUS | Status: DC

## 2020-02-04 MED ORDER — BUDESONIDE 0.5 MG/2ML IN SUSP
0.5000 mg | Freq: Two times a day (BID) | RESPIRATORY_TRACT | Status: DC
  Administered 2020-02-04: 0.5 mg via RESPIRATORY_TRACT
  Filled 2020-02-04: qty 2

## 2020-02-04 MED ORDER — ALBUTEROL SULFATE HFA 108 (90 BASE) MCG/ACT IN AERS
2.0000 | INHALATION_SPRAY | Freq: Four times a day (QID) | RESPIRATORY_TRACT | Status: DC
  Administered 2020-02-04 (×3): 2 via RESPIRATORY_TRACT

## 2020-02-04 MED ORDER — HEPARIN SODIUM (PORCINE) 5000 UNIT/ML IJ SOLN
5000.0000 [IU] | Freq: Three times a day (TID) | INTRAMUSCULAR | Status: DC
  Administered 2020-02-04 – 2020-02-05 (×3): 5000 [IU] via SUBCUTANEOUS
  Filled 2020-02-04 (×3): qty 1

## 2020-02-04 NOTE — ED Notes (Signed)
RT contacted for nebulizer treatment.

## 2020-02-04 NOTE — H&P (Signed)
TRH H&P    Patient Demographics:    Douglas Simpson, is a 54 y.o. male  MRN: [id number]  DOB - [birth year]  Admit Date - 02/03/2020  Referring MD/NP/PA: Long  Outpatient Primary MD for the patient is Patient, No Pcp Per  Patient coming from: Home  Chief complaint-dyspnea   HPI:    Douglas Simpson  is a 54 y.o. male, with no known medical history who does not follow with a PCP presents to the ED for dyspnea.  Patient reports that this has been going on for about 2 weeks.  He has been coughing and cannot breathe.  The cough is productive of light green sputum.  Patient reports it has been worsening over the 2 weeks.  He has no recorded fever, but reports that he is woken up diaphoretic at night.  He thinks that may be an indication that he was feverish.  He has never had breathing problems like this before.  Patient is not vaccinated for Covid because he does not believe in it.  Patient reports that he has been lightheaded at times when he is coughing.  He has had some nausea but no vomiting.  He has no diarrhea no constipation.  Patient reports he does not take any medications at home including no inhalers.  He denies chest pain and palpitations.  He has no other complaints at this time.  In the ED temperature 98.2, heart rate 89-95, respiratory rate 19-28, blood pressure 132/76, satting at 93% on 2 L nasal cannula White blood cell count 17.7 hemoglobin is 14.1 CHEM panel is unremarkable LDH is 138, triglycerides 167, ferritin 163, CRP 13.5, lactic acid 1.2, pro-Cal is less than 0.10, fibrinogen is greater than 800, D-dimer is 0.46, Covid is negative Chest x-ray shows no acute process CTA shows findings consistent with prior granulomatous disease.  Atypical pneumonia.  Opacities are in the upper lobes bilaterally     Review of systems:    In addition to the HPI above,  Review of Systems  Constitutional: Positive  for diaphoresis and malaise/fatigue. Negative for chills and fever.  HENT: Positive for sore throat. Negative for congestion and sinus pain.   Eyes: Negative for blurred vision and double vision.  Respiratory: Positive for cough, sputum production, shortness of breath and wheezing. Negative for hemoptysis.   Cardiovascular: Negative for chest pain, palpitations, orthopnea and leg swelling.  Gastrointestinal: Negative for abdominal pain, constipation, diarrhea, nausea and vomiting.  Genitourinary: Negative for dysuria, frequency and urgency.  Musculoskeletal: Negative for falls and myalgias.  Skin: Positive for rash.  Neurological: Positive for dizziness. Negative for tingling, tremors, weakness and headaches.  Psychiatric/Behavioral: Negative for substance abuse.     All other systems reviewed and are negative.    Past History of the following :    History reviewed. No pertinent past medical history.    Past Surgical History:  Procedure Laterality Date  . LEG SURGERY    . TONSILLECTOMY        Social History:      Social History  Tobacco Use  . Smoking status: Current Every Day Smoker    Packs/day: 1.00    Types: Cigarettes  . Smokeless tobacco: Never Used  Substance Use Topics  . Alcohol use: Yes    Alcohol/week: 6.0 standard drinks    Types: 6 Cans of beer per week    Comment: daily       Family History :    No family history on file.    Home Medications:   Prior to Admission medications   Medication Sig Start Date End Date Taking? Authorizing Provider  acetaminophen (TYLENOL) 500 MG tablet Take 500 mg by mouth every 6 (six) hours as needed for mild pain or moderate pain.    [provider]  aspirin EC 81 MG EC tablet Take 1 tablet (81 mg total) by mouth daily. 02/07/16   Kathie Dike, MD  Aspirin-Acetaminophen-Caffeine (GOODY HEADACHE PO) Take 1 packet by mouth daily as needed (for pain).    [provider]  diphenhydrAMINE (BENADRYL)  25 MG tablet Take 25 mg by mouth every 6 (six) hours as needed for itching or allergies.    [provider]  famotidine (HEARTBURN RELIEF) 10 MG tablet Take 10 mg by mouth daily as needed for heartburn or indigestion.    [provider]  hydrOXYzine (ATARAX/VISTARIL) 25 MG tablet Take 1 tablet (25 mg total) by mouth 3 (three) times daily as needed for anxiety. 02/06/16   Kathie Dike, MD     Allergies:     Allergies  Allergen Reactions  . Codeine Nausea Only     Physical Exam:   Vitals  Blood pressure 118/75, pulse 80, temperature 98.2 F (36.8 C), temperature source Oral, resp. rate (!) 22, height 5\' 8"  (1.727 m), weight 95.3 kg, SpO2 97 %.  1.  General: Sitting up on side of bed, no acute distress  2. Psychiatric: Cooperative with exam Little insight into healthcare  3. Neurologic: Cranial nerves II through XII are grossly intact, no focal deficits on limited exam, moves all 4 extremities voluntarily  4. HEENMT:  Head is atraumatic,, pupils reactive to light, neck is supple, trachea is midline, mucous membranes are moist  5. Respiratory : Lungs with wheezes and rhonchi bilaterally No crackles  6. Cardiovascular : Heart rate and rhythm are regular, no murmurs rubs or gallops  7. Gastrointestinal:  Abdomen is soft, nondistended, nontender to palpation  8. Skin:  Multiple tattoos, no acute lesions on limited exam Psoriasis plaques on both lower extremities  9.Musculoskeletal:  No peripheral edema, no acute deformity    Data Review:    CBC Recent Labs  Lab 02/03/20 1822  WBC 17.7*  HGB 14.1  HCT 44.8  PLT 334  MCV 90.9  MCH 28.6  MCHC 31.5  RDW 13.0  LYMPHSABS 3.0  MONOABS 1.9*  EOSABS 0.2  BASOSABS 0.1   ------------------------------------------------------------------------------------------------------------------  Results for orders placed or performed during the hospital encounter of 02/03/20 (from the past 48 hour(s))    SARS Coronavirus 2 by RT PCR (hospital order, performed in [facility] hospital lab) Nasopharyngeal Nasopharyngeal Swab     Status: None   Collection Time: 02/03/20  6:22 PM   Specimen: Nasopharyngeal Swab  Result Value Ref Range   SARS Coronavirus 2 NEGATIVE NEGATIVE    Comment: (NOTE) SARS-CoV-2 target nucleic acids are NOT DETECTED.  The SARS-CoV-2 RNA is generally detectable in upper and lower respiratory specimens during the acute phase of infection. The lowest concentration of SARS-CoV-2 viral copies this assay can  detect is 250 copies / mL. A negative result does not preclude SARS-CoV-2 infection and should not be used as the sole basis for treatment or other patient management decisions.  A negative result may occur with improper specimen collection / handling, submission of specimen other than nasopharyngeal swab, presence of viral mutation(s) within the areas targeted by this assay, and inadequate number of viral copies (<250 copies / mL). A negative result must be combined with clinical observations, patient history, and epidemiological information.  Fact Sheet for Patients:   StrictlyIdeas.no  Fact Sheet for Healthcare Providers: BankingDealers.co.za  This test is not yet approved or  cleared by the [place] FDA and has been authorized for detection and/or diagnosis of SARS-CoV-2 by FDA under an Emergency Use Authorization (EUA).  This EUA will remain in effect (meaning this test can be used) for the duration of the COVID-19 declaration under Section 564(b)(1) of the Act, 21 U.S.C. section 360bbb-3(b)(1), unless the authorization is terminated or revoked sooner.  Performed at [facility], [address] [place], [place] [address]   Lactic acid, plasma     Status: None   Collection Time: 02/03/20  6:22 PM  Result Value Ref Range   Lactic Acid, Venous 1.2 0.5 - 1.9 mmol/L    Comment: Performed at [facility], [address], [place], [place] [address]  Blood Culture (routine x 2)     Status: None (Preliminary result)   Collection Time: 02/03/20  6:22 PM   Specimen: Left Antecubital; Blood  Result Value Ref Range   Specimen Description LEFT ANTECUBITAL    Special Requests      BOTTLES DRAWN AEROBIC AND ANAEROBIC Blood Culture adequate volume Performed at [facility], [address], [place], [place] [address]    Culture PENDING    Report Status PENDING   CBC WITH DIFFERENTIAL     Status: Abnormal   Collection Time: 02/03/20  6:22 PM  Result Value Ref Range   WBC 17.7 (H) 4.0 - 10.5 K/uL   RBC 4.93 4.22 - 5.81 MIL/uL   Hemoglobin 14.1 13.0 - 17.0 g/dL   HCT 44.8 39 - 52 %   MCV 90.9 80.0 - 100.0 fL   MCH 28.6 26.0 - 34.0 pg   MCHC 31.5 30.0 - 36.0 g/dL   RDW 13.0 11.5 - 15.5 %   Platelets 334 150 - 400 K/uL   nRBC 0.0 0.0 - 0.2 %   Neutrophils Relative % 69 %   Neutro Abs 12.4 (H) 1.7 - 7.7 K/uL   Lymphocytes Relative 17 %   Lymphs Abs 3.0 0.7 - 4.0 K/uL   Monocytes Relative 11 %   Monocytes Absolute 1.9 (H) 0 - 1 K/uL   Eosinophils Relative 1 %   Eosinophils Absolute 0.2 0 - 0 K/uL   Basophils Relative 1 %   Basophils Absolute 0.1 0 - 0 K/uL   Immature Granulocytes 1 %   Abs Immature Granulocytes 0.18 (H) 0.00 - 0.07 K/uL    Comment: Performed at [facility], [address] [place], [place] [address]  Comprehensive metabolic panel     Status: Abnormal   Collection Time: 02/03/20  6:22 PM  Result Value Ref Range   Sodium 135 135 - 145 mmol/L   Potassium 3.7 3.5 - 5.1 mmol/L   Chloride 97 (L) 98 - 111 mmol/L   CO2 28 22 - 32 mmol/L   Glucose, Bld 72 70 - 99 mg/dL    Comment: Glucose reference range applies  only to samples taken after fasting for at least 8 hours.   BUN 10 6 - 20 mg/dL   Creatinine, Ser 0.76 0.61 - 1.24 mg/dL   Calcium 8.8 (L) 8.9 - 10.3 mg/dL   Total Protein 7.6 6.5 - 8.1 g/dL   Albumin 3.9 3.5 - 5.0 g/dL   AST 14 (L) 15 - 41 U/L   ALT 23 0 - 44 U/L    Alkaline Phosphatase 63 38 - 126 U/L   Total Bilirubin 0.7 0.3 - 1.2 mg/dL   GFR calc non Af Amer >60 >60 mL/min   GFR calc Af Amer >60 >60 mL/min   Anion gap 10 5 - 15    Comment: Performed at [facility], [address] [place], [place] [address]  D-dimer, quantitative     Status: None   Collection Time: 02/03/20  6:22 PM  Result Value Ref Range   D-Dimer, Quant 0.46 0.00 - 0.50 ug/mL-FEU    Comment: (NOTE) At the manufacturer cut-off of 0.50 ug/mL FEU, this assay has been documented to exclude PE with a sensitivity and negative predictive value of 97 to 99%.  At this time, this assay has not been approved by the FDA to exclude DVT/VTE. Results should be correlated with clinical presentation. Performed at [facility], [address] [place], [place] [address]   Procalcitonin     Status: None   Collection Time: 02/03/20  6:22 PM  Result Value Ref Range   Procalcitonin <0.10 ng/mL    Comment:        Interpretation: PCT (Procalcitonin) <= 0.5 ng/mL: Systemic infection (sepsis) is not likely. Local bacterial infection is possible. (NOTE)       Sepsis PCT Algorithm           Lower Respiratory Tract                                      Infection PCT Algorithm    ----------------------------     ----------------------------         PCT < 0.25 ng/mL                PCT < 0.10 ng/mL          Strongly encourage             Strongly discourage   discontinuation of antibiotics    initiation of antibiotics    ----------------------------     -----------------------------       PCT 0.25 - 0.50 ng/mL            PCT 0.10 - 0.25 ng/mL               OR       >80% decrease in PCT            Discourage initiation of                                            antibiotics      Encourage discontinuation           of antibiotics    ----------------------------     -----------------------------         PCT >= 0.50 ng/mL              PCT 0.26 - 0.50 ng/mL  AND        <80% decrease  in PCT             Encourage initiation of                                             antibiotics       Encourage continuation           of antibiotics    ----------------------------     -----------------------------        PCT >= 0.50 ng/mL                  PCT > 0.50 ng/mL               AND         increase in PCT                  Strongly encourage                                      initiation of antibiotics    Strongly encourage escalation           of antibiotics                                     -----------------------------                                           PCT <= 0.25 ng/mL                                                 OR                                        > 80% decrease in PCT                                      Discontinue / Do not initiate                                             antibiotics  Performed at [facility], [address] [place], [place] [address]   Lactate dehydrogenase     Status: None   Collection Time: 02/03/20  6:22 PM  Result Value Ref Range   LDH 138 98 - 192 U/L    Comment: Performed at [facility], [address] [place], [place] [address]  Ferritin     Status: None   Collection Time: 02/03/20  6:22 PM  Result Value Ref Range   Ferritin 163 24 - 336 ng/mL    Comment: Performed at [facility], [address], [place],  [place] [address]  Triglycerides     Status: Abnormal   Collection Time: 02/03/20  6:22 PM  Result Value Ref Range   Triglycerides 167 (H) <150 mg/dL    Comment: Performed at [facility], [address], [place], [place] [address]  Fibrinogen     Status: Abnormal   Collection Time: 02/03/20  6:22 PM  Result Value Ref Range   Fibrinogen >800 (H) 210 - 475 mg/dL    Comment: Performed at [facility], [address] [place], [place] [address]  C-reactive protein     Status: Abnormal   Collection Time: 02/03/20  6:22 PM  Result Value Ref Range   CRP 13.5 (H) <1.0 mg/dL    Comment: Performed at [facility], [address], [place], [place] [address]  Blood Culture (routine x 2)     Status: None (Preliminary result)   Collection Time: 02/03/20  6:27 PM   Specimen: Right Antecubital; Blood  Result Value Ref Range   Specimen Description RIGHT ANTECUBITAL    Special Requests      BOTTLES DRAWN AEROBIC AND ANAEROBIC Blood Culture adequate volume Performed at [facility], [address] [place],  [address]    Culture PENDING    Report Status PENDING   POC CBG, ED     Status: Abnormal   Collection Time: 02/03/20  8:52 PM  Result Value Ref Range   Glucose-Capillary 115 (H) 70 - 99 mg/dL    Comment: Glucose reference range applies only to samples taken after fasting for at least 8 hours.    Chemistries  Recent Labs  Lab 02/03/20 1822  NA 135  K 3.7  CL 97*  CO2 28  GLUCOSE 72  BUN 10  CREATININE 0.76  CALCIUM 8.8*  AST 14*  ALT 23  ALKPHOS 63  BILITOT 0.7   ------------------------------------------------------------------------------------------------------------------  ------------------------------------------------------------------------------------------------------------------ GFR: Estimated Creatinine Clearance: 115.5 mL/min (by C-G formula based on SCr of 0.76 mg/dL). Liver Function Tests: Recent Labs  Lab 02/03/20 1822  AST 14*  ALT 23  ALKPHOS 63  BILITOT 0.7  PROT 7.6  ALBUMIN 3.9   No results for input(s): LIPASE, AMYLASE in the last 168 hours. No results for input(s): AMMONIA in the last 168 hours. Coagulation Profile: No results for input(s): INR, PROTIME in the last 168 hours. Cardiac Enzymes: No results for input(s): CKTOTAL, CKMB, CKMBINDEX, TROPONINI in the last 168 hours. BNP (last 3 results) No results for input(s): PROBNP in the last 8760 hours. HbA1C: No results for input(s): HGBA1C in the last 72 hours. CBG: Recent Labs  Lab 02/03/20 2052  GLUCAP 115*   Lipid Profile: Recent Labs    02/03/20 1822  TRIG 167*   Thyroid  Function Tests: No results for input(s): TSH, T4TOTAL, FREET4, T3FREE, THYROIDAB in the last 72 hours. Anemia Panel: Recent Labs    02/03/20 1822  FERRITIN 163    --------------------------------------------------------------------------------------------------------------- Urine analysis: No results found for: COLORURINE, APPEARANCEUR, LABSPEC, PHURINE, GLUCOSEU, HGBUR, BILIRUBINUR, KETONESUR, PROTEINUR, UROBILINOGEN, NITRITE, LEUKOCYTESUR    Imaging Results:    CT Angio Chest PE W and/or Wo Contrast  Result Date: 02/03/2020 CLINICAL DATA:  Shortness of breath for 2 weeks with productive cough EXAM: CT ANGIOGRAPHY CHEST WITH CONTRAST TECHNIQUE: Multidetector CT imaging of the chest was performed using the standard protocol during bolus administration of intravenous contrast. Multiplanar CT image reconstructions and MIPs were obtained to evaluate the vascular anatomy. CONTRAST:  123mL OMNIPAQUE IOHEXOL 350 MG/ML SOLN COMPARISON:  Chest x-ray from earlier in  the same day. FINDINGS: Cardiovascular: Thoracic aorta demonstrates a normal branching pattern. Mild dilatation of the ascending aorta to 4.1 cm is noted. No dissection is seen. No cardiac enlargement is noted. The pulmonary artery is well visualized with a normal branching pattern. No definitive filling defect is identified to suggest pulmonary embolism. Mediastinum/Nodes: The thoracic inlet is within normal limits. Scattered small mediastinal lymph nodes are noted although not significant by size criteria. Some of these are calcified consistent with prior granulomatous disease. There are hilar lymph nodes identified bilaterally right slightly greater than left also with some degree of calcification consistent with prior granulomatous disease. The esophagus as visualized is within normal limits. Lungs/Pleura: The lungs are well aerated bilaterally. Diffuse tree-in-bud opacities are noted particularly in the upper lobes bilaterally consistent  with atypical pneumonia. These changes could be related to underlying sarcoid given granulomatous changes. No sizable effusion is noted. No confluent infiltrate is seen. Upper Abdomen: Multiple calcified splenic granulomas are noted. The remainder of the upper abdomen appears within normal limits. Musculoskeletal: Degenerative changes of the thoracic spine without acute bony abnormality. Review of the MIP images confirms the above findings. IMPRESSION: Changes consistent with prior granulomatous disease as described. Tree-in-bud opacities primarily within the upper lobes bilaterally consistent with atypical pneumonia. This could represent underlying sarcoid. The need for further workup can be determined on a clinical basis. No evidence of pulmonary emboli. 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 Recommend annual imaging followup by CTA or MRA. This recommendation follows 2010 ACCF/AHA/AATS/ACR/ASA/SCA/SCAI/SIR/STS/SVM Guidelines for the Diagnosis and Management of Patients with Thoracic Aortic Disease. Circulation. 2010; 121: N397-Q734. Aortic aneurysm NOS (ICD10-I71.9) Aortic Atherosclerosis (ICD10-I70.0). Electronically Signed   By: Inez Catalina M.D.   On: 02/03/2020 22:05   DG Chest Port 1 View  Result Date: 02/03/2020 CLINICAL DATA:  Short of breath for 2 weeks, productive cough EXAM: PORTABLE CHEST 1 VIEW COMPARISON:  02/05/2016 FINDINGS: Single frontal view of the chest demonstrates an unremarkable cardiac silhouette. No acute airspace disease, effusion, or pneumothorax. No acute bony abnormalities. IMPRESSION: 1. No acute intrathoracic process. Electronically Signed   By: Randa Ngo M.D.   On: 02/03/2020 18:30    My personal review of EKG: Rhythm NSR, Rate 93/min, QTc 423,no Acute ST changes   Assessment & Plan:    Active Problems:   CAP (community acquired pneumonia)   1. Heart failure with hypoxia 1. O2 sat down to 90% 2. Tachypneic at arrival up to 28 breaths/min 3. Improved with  2 L nasal cannula 4. Secondary to community-acquired pneumonia 5. Wean off O2 as tolerated 6. See plan below 2. Community-acquired pneumonia 1. Pneumonia admission order set utilized 2. Sputum culture, urine Legionella, urine strep pending 3. Patient started on Rocephin and Zithromax in the ED 4. More likely viral illness as the procalcitonin is less than 0.10 -respiratory panel pending 5. Covid negative 6. Leukocytosis at 17.7 7. Continue to monitor 3. Elevated fibrinogen 1. Acute phase reactant -in setting of atypical pneumonia 2. No indication to trend at this time 4. Tobacco use disorder 1. Contemplative stage regarding smoking cessation 2. Counseled regarding the importance of smoking cessation especially in light of this acute lung disease    DVT Prophylaxis-   Heparin- SCDs   AM Labs Ordered, also please review Full Orders  Family Communication: No family at bedside  Code Status: Full  Admission status: Inpatient :The appropriate admission status for this patient is INPATIENT. Inpatient status is judged to be reasonable and necessary in  order to provide the required intensity of service to ensure the patient's safety. The patient's presenting symptoms, physical exam findings, and initial radiographic and laboratory data in the context of their chronic comorbidities is felt to place them at high risk for further clinical deterioration. Furthermore, it is not anticipated that the patient will be medically stable for discharge from the hospital within 2 midnights of admission. The following factors support the admission status of inpatient.     The patient's presenting symptoms include dyspnea The worrisome physical exam findings include hypoxia down to 90%. The initial radiographic and laboratory data are worrisome because of atypical pneumonia on CTA. The chronic co-morbidities include no known medical history       * I certify that at the point of admission it is my  clinical judgment that the patient will require inpatient hospital care spanning beyond 2 midnights from the point of admission due to high intensity of service, high risk for further deterioration and high frequency of surveillance required.*  Time spent in minutes : [place]

## 2020-02-04 NOTE — ED Notes (Signed)
Pt given meal tray.

## 2020-02-04 NOTE — Progress Notes (Signed)
Patient received full shower. No assistance required. Tolerated well.

## 2020-02-04 NOTE — Progress Notes (Signed)
Patient seen and examined. Admitted after midnight secondary to worsening SOB, increase wheezing and productive cough. COVID test neg. CTA chest demonstrating bilateral upper lobes infiltrates and bronchitic changes. Hemodynamically stable and afebrile currently; positive bilateral rhonchi, diffuse wheezing and inability to speak in full sentences.  Using 2 L nasal cannula supplementation to maintain O2 sat above 92%. Please refer to H&P written by Dr. Clearence Ped on 02/04/20 for further info/details on admission.  Plan: -Continue IV steroids, use of flutter valve and current antibiotic therapy -Respiratory panel positive for rhinovirus -Wean oxygen supplementation as tolerated. -Discharge given with Pulmicort and DuoNeb. -Follow clinical response.  Barton Dubois MD [phone]

## 2020-02-04 NOTE — ED Notes (Signed)
Meal tray provided.

## 2020-02-04 NOTE — ED Notes (Signed)
Pt given meal tray and sprite.

## 2020-02-05 DIAGNOSIS — J218 Acute bronchiolitis due to other specified organisms: Secondary | ICD-10-CM

## 2020-02-05 DIAGNOSIS — B9789 Other viral agents as the cause of diseases classified elsewhere: Secondary | ICD-10-CM

## 2020-02-05 MED ORDER — ALBUTEROL SULFATE HFA 108 (90 BASE) MCG/ACT IN AERS: 2.0000 | INHALATION_SPRAY | Freq: Four times a day (QID) | RESPIRATORY_TRACT | 2 refills | Status: AC | PRN

## 2020-02-05 MED ORDER — PREDNISONE 20 MG PO TABS: 40.0000 mg | ORAL_TABLET | Freq: Every day | ORAL | 0 refills | Status: AC

## 2020-02-05 NOTE — Plan of Care (Signed)

## 2020-02-05 NOTE — Progress Notes (Addendum)
Patient stated he did not receive a breakfast tray. Offered patient a biscuit with jelly and cereal but patient refused and stated he wants a real breakfast. Dietary called and host will bring patient a breakfast tray.   Patient refused breakfast tray the host brought to him.

## 2020-02-05 NOTE — Plan of Care (Signed)
  Problem: Education: Goal: Knowledge of General Education information will improve Description: Including pain rating scale, medication(s)/side effects and non-pharmacologic comfort measures 02/05/2020 1039 by Cameron Ali, RN Outcome: Completed/Met 02/05/2020 1021 by Cameron Ali, RN Outcome: Progressing   Problem: Health Behavior/Discharge Planning: Goal: Ability to manage health-related needs will improve 02/05/2020 1039 by Cameron Ali, RN Outcome: Completed/Met 02/05/2020 1021 by Cameron Ali, RN Outcome: Progressing   Problem: Clinical Measurements: Goal: Ability to maintain clinical measurements within normal limits will improve 02/05/2020 1039 by Cameron Ali, RN Outcome: Completed/Met 02/05/2020 1021 by Cameron Ali, RN Outcome: Progressing Goal: Will remain free from infection 02/05/2020 1039 by Cameron Ali, RN Outcome: Completed/Met 02/05/2020 1021 by Cameron Ali, RN Outcome: Progressing Goal: Diagnostic test results will improve 02/05/2020 1039 by Cameron Ali, RN Outcome: Completed/Met 02/05/2020 1021 by Cameron Ali, RN Outcome: Progressing Goal: Respiratory complications will improve 02/05/2020 1039 by Cameron Ali, RN Outcome: Completed/Met 02/05/2020 1021 by Cameron Ali, RN Outcome: Progressing Goal: Cardiovascular complication will be avoided 02/05/2020 1039 by Cameron Ali, RN Outcome: Completed/Met 02/05/2020 1021 by Cameron Ali, RN Outcome: Progressing   Problem: Activity: Goal: Risk for activity intolerance will decrease 02/05/2020 1039 by Cameron Ali, RN Outcome: Completed/Met 02/05/2020 1021 by Cameron Ali, RN Outcome: Progressing   Problem: Nutrition: Goal: Adequate nutrition will be maintained 02/05/2020 1039 by Cameron Ali, RN Outcome: Completed/Met 02/05/2020 1021 by Cameron Ali, RN Outcome: Progressing   Problem: Coping: Goal: Level of anxiety will  decrease 02/05/2020 1039 by Cameron Ali, RN Outcome: Completed/Met 02/05/2020 1021 by Cameron Ali, RN Outcome: Progressing   Problem: Elimination: Goal: Will not experience complications related to bowel motility 02/05/2020 1039 by Cameron Ali, RN Outcome: Completed/Met 02/05/2020 1021 by Cameron Ali, RN Outcome: Progressing Goal: Will not experience complications related to urinary retention 02/05/2020 1039 by Cameron Ali, RN Outcome: Completed/Met 02/05/2020 1021 by Cameron Ali, RN Outcome: Progressing   Problem: Pain Managment: Goal: General experience of comfort will improve 02/05/2020 1039 by Cameron Ali, RN Outcome: Completed/Met 02/05/2020 1021 by Cameron Ali, RN Outcome: Progressing   Problem: Safety: Goal: Ability to remain free from injury will improve 02/05/2020 1039 by Cameron Ali, RN Outcome: Completed/Met 02/05/2020 1021 by Cameron Ali, RN Outcome: Progressing   Problem: Skin Integrity: Goal: Risk for impaired skin integrity will decrease 02/05/2020 1039 by Cameron Ali, RN Outcome: Completed/Met 02/05/2020 1021 by Cameron Ali, RN Outcome: Progressing

## 2020-02-05 NOTE — TOC Transition Note (Signed)
Transition of Care [facility]) - CM/SW Discharge Note   Patient Details  Name: Douglas Simpson MRN: [id number] Date of Birth: 1966-02-07  [facility] CM/SW Contact:  Iona Beard, [place] Phone Number: 02/05/2020, 11:38 AM   Clinical Narrative:    Pt admitted for community acquired pneumonia. TOC made referal to [facility] due to pt not having PCP and no insurance. [facility] stated they will contact pt around 24 hours after D/C. TOC signing off.          Patient Goals and CMS Choice        Discharge Placement                       Discharge Plan and Services                                     Social Determinants of Health (SDOH) Interventions     Readmission Risk Interventions No flowsheet data found.

## 2020-02-05 NOTE — Progress Notes (Signed)
Patient has been on 3 liters of oxygen since admission on 02/04/20. Patient is being discharged and is very anxious and agitated at this time. Patient stated he is very emotional. Patient is also refusing to take oxygen home and to keep it on while in hospital. Patient has been on 3  liters of oxygen and stated he will not use it. Made Dr. Manuella Ghazi aware. Patient stated he will not any longer to be discharged and will wait downstairs for his ride.

## 2020-02-05 NOTE — Discharge Summary (Signed)
Physician Discharge Summary  Douglas Simpson TKW:[id number] DOB: 1965/11/25 DOA: 02/03/2020  PCP: Patient, No Pcp Per  Admit date: 02/03/2020  Discharge date: 02/05/2020  Admitted From:Home  Disposition:  Home  Recommendations for Outpatient Follow-up:  1. Follow up with PCP in 1-2 weeks through [facility] which will be arranged 2. Please obtain BMP/CBC in one week 3. Continue on prednisone as prescribed for 5 more days 4. Albuterol inhaler given as needed for shortness of breath or wheezing 5. Continue antitussives at home as needed for symptomatic management  Home Health: None  Equipment/Devices: None, patient refused any supplemental oxygen  Discharge Condition: Stable  CODE STATUS: Full  Diet recommendation: Heart Healthy  Brief/Interim Summary: Per HPI: Douglas Simpson  is a 54 y.o. male, with no known medical history who does not follow with a PCP presents to the ED for dyspnea.  Patient reports that this has been going on for about 2 weeks.  He has been coughing and cannot breathe.  The cough is productive of light green sputum.  Patient reports it has been worsening over the 2 weeks.  He has no recorded fever, but reports that he is woken up diaphoretic at night.  He thinks that may be an indication that he was feverish.  He has never had breathing problems like this before.  Patient is not vaccinated for Covid because he does not believe in it.  Patient reports that he has been lightheaded at times when he is coughing.  He has had some nausea but no vomiting.  He has no diarrhea no constipation.  Patient reports he does not take any medications at home including no inhalers.  He denies chest pain and palpitations.  He has no other complaints at this time.  -Patient was admitted with suspicion of community-acquired pneumonia.  Covid testing was negative and CT angiogram of the chest with no significant findings aside from some mild infiltrates.  He had a respiratory panel that  was positive for rhinovirus and was maintained on some IV steroids as well as IV antibiotic treatment during the course of his stay.  He overall feels much improved and is somewhat aggravated this morning and would like to go home if possible.  He was on 3 L nasal cannula oxygen while hospitalized and did not have any significant respiratory distress and did not desire any home oxygen supplementation.  He will be discharged with oral prednisone at this time for 5 more days as well as albuterol as needed for now shortness of breath or wheezing.  He will continue symptomatic treatment with antitussives as needed as well and does not require any further antibiotics.  He does not have a PCP and will follow up with a primary care provider through [facility] in the near future.  Discharge Diagnoses:  Active Problems:   CAP (community acquired pneumonia)  Principal discharge diagnosis: Viral bronchitis secondary to rhinovirus.  Discharge Instructions  Discharge Instructions    Diet - low sodium heart healthy   Complete by: As directed    Increase activity slowly   Complete by: As directed      Allergies as of 02/05/2020      Reactions   Codeine Nausea Only      Medication List    TAKE these medications   acetaminophen 500 MG tablet Commonly known as: TYLENOL Take 500 mg by mouth every 6 (six) hours as needed for mild pain or moderate pain.   albuterol 108 (90 Base) MCG/ACT inhaler  Commonly known as: VENTOLIN HFA Inhale 2 puffs into the lungs every 6 (six) hours as needed for wheezing or shortness of breath.   aspirin 81 MG EC tablet Take 1 tablet (81 mg total) by mouth daily.   diphenhydrAMINE 25 MG tablet Commonly known as: BENADRYL Take 25 mg by mouth every 6 (six) hours as needed for itching or allergies.   hydrOXYzine 25 MG tablet Commonly known as: ATARAX/VISTARIL Take 1 tablet (25 mg total) by mouth 3 (three) times daily as needed for anxiety.   predniSONE 20 MG  tablet Commonly known as: Deltasone Take 2 tablets (40 mg total) by mouth daily for 5 days.       Follow-up Information    pcp. Schedule an appointment as soon as possible for a visit in 1 week(s).              Allergies  Allergen Reactions   Codeine Nausea Only    Consultations:  None   Procedures/Studies: CT Angio Chest PE W and/or Wo Contrast  Result Date: 02/03/2020 CLINICAL DATA:  Shortness of breath for 2 weeks with productive cough EXAM: CT ANGIOGRAPHY CHEST WITH CONTRAST TECHNIQUE: Multidetector CT imaging of the chest was performed using the standard protocol during bolus administration of intravenous contrast. Multiplanar CT image reconstructions and MIPs were obtained to evaluate the vascular anatomy. CONTRAST:  178mL OMNIPAQUE IOHEXOL 350 MG/ML SOLN COMPARISON:  Chest x-ray from earlier in the same day. FINDINGS: Cardiovascular: Thoracic aorta demonstrates a normal branching pattern. Mild dilatation of the ascending aorta to 4.1 cm is noted. No dissection is seen. No cardiac enlargement is noted. The pulmonary artery is well visualized with a normal branching pattern. No definitive filling defect is identified to suggest pulmonary embolism. Mediastinum/Nodes: The thoracic inlet is within normal limits. Scattered small mediastinal lymph nodes are noted although not significant by size criteria. Some of these are calcified consistent with prior granulomatous disease. There are hilar lymph nodes identified bilaterally right slightly greater than left also with some degree of calcification consistent with prior granulomatous disease. The esophagus as visualized is within normal limits. Lungs/Pleura: The lungs are well aerated bilaterally. Diffuse tree-in-bud opacities are noted particularly in the upper lobes bilaterally consistent with atypical pneumonia. These changes could be related to underlying sarcoid given granulomatous changes. No sizable effusion is noted. No confluent  infiltrate is seen. Upper Abdomen: Multiple calcified splenic granulomas are noted. The remainder of the upper abdomen appears within normal limits. Musculoskeletal: Degenerative changes of the thoracic spine without acute bony abnormality. Review of the MIP images confirms the above findings. IMPRESSION: Changes consistent with prior granulomatous disease as described. Tree-in-bud opacities primarily within the upper lobes bilaterally consistent with atypical pneumonia. This could represent underlying sarcoid. The need for further workup can be determined on a clinical basis. No evidence of pulmonary emboli. 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 Recommend annual imaging followup by CTA or MRA. This recommendation follows 2010 ACCF/AHA/AATS/ACR/ASA/SCA/SCAI/SIR/STS/SVM Guidelines for the Diagnosis and Management of Patients with Thoracic Aortic Disease. Circulation. 2010; 121: F681-E751. Aortic aneurysm NOS (ICD10-I71.9) Aortic Atherosclerosis (ICD10-I70.0). Electronically Signed   By: Inez Catalina M.D.   On: 02/03/2020 22:05   DG Chest Port 1 View  Result Date: 02/03/2020 CLINICAL DATA:  Short of breath for 2 weeks, productive cough EXAM: PORTABLE CHEST 1 VIEW COMPARISON:  02/05/2016 FINDINGS: Single frontal view of the chest demonstrates an unremarkable cardiac silhouette. No acute airspace disease, effusion, or pneumothorax. No acute bony abnormalities. IMPRESSION: 1. No  acute intrathoracic process. Electronically Signed   By: Randa Ngo M.D.   On: 02/03/2020 18:30     Discharge Exam: Vitals:   02/05/20 0521 02/05/20 0716  BP: 122/72   Pulse: 65   Resp: 20   Temp: (!) 97.2 F (36.2 C)   SpO2: 98% 98%   Vitals:   02/04/20 2155 02/05/20 0241 02/05/20 0521 02/05/20 0716  BP: 134/79 134/75 122/72   Pulse: 82 79 65   Resp: 20  20   Temp: 97.8 F (36.6 C) 97.7 F (36.5 C) (!) 97.2 F (36.2 C)   TempSrc: Oral Oral    SpO2: 96% 99% 98% 98%  Weight:      Height:        General:  Pt is alert, awake, not in acute distress Cardiovascular: RRR, S1/S2 +, no rubs, no gallops Respiratory: CTA bilaterally, no wheezing, no rhonchi Abdominal: Soft, NT, ND, bowel sounds + Extremities: no edema, no cyanosis    The results of significant diagnostics from this hospitalization (including imaging, microbiology, ancillary and laboratory) are listed below for reference.     Microbiology: Recent Results (from the past 240 hour(s))  SARS Coronavirus 2 by RT PCR (hospital order, performed in [facility] hospital lab) Nasopharyngeal Nasopharyngeal Swab     Status: None   Collection Time: 02/03/20  6:22 PM   Specimen: Nasopharyngeal Swab  Result Value Ref Range Status   SARS Coronavirus 2 NEGATIVE NEGATIVE Final    Comment: (NOTE) SARS-CoV-2 target nucleic acids are NOT DETECTED.  The SARS-CoV-2 RNA is generally detectable in upper and lower respiratory specimens during the acute phase of infection. The lowest concentration of SARS-CoV-2 viral copies this assay can detect is 250 copies / mL. A negative result does not preclude SARS-CoV-2 infection and should not be used as the sole basis for treatment or other patient management decisions.  A negative result may occur with improper specimen collection / handling, submission of specimen other than nasopharyngeal swab, presence of viral mutation(s) within the areas targeted by this assay, and inadequate number of viral copies (<250 copies / mL). A negative result must be combined with clinical observations, patient history, and epidemiological information.  Fact Sheet for Patients:   StrictlyIdeas.no  Fact Sheet for Healthcare Providers: BankingDealers.co.za  This test is not yet approved or  cleared by the [place] FDA and has been authorized for detection and/or diagnosis of SARS-CoV-2 by FDA under an Emergency Use Authorization (EUA).  This EUA will remain in effect  (meaning this test can be used) for the duration of the COVID-19 declaration under Section 564(b)(1) of the Act, 21 U.S.C. section 360bbb-3(b)(1), unless the authorization is terminated or revoked sooner.  Performed at [facility], [address], [place], [place] [address]   Blood Culture (routine x 2)     Status: None (Preliminary result)   Collection Time: 02/03/20  6:22 PM   Specimen: Left Antecubital; Blood  Result Value Ref Range Status   Specimen Description LEFT ANTECUBITAL  Final   Special Requests   Final    BOTTLES DRAWN AEROBIC AND ANAEROBIC Blood Culture adequate volume   Culture   Final    NO GROWTH 2 DAYS Performed at [facility], [address], [place], [place] [address]    Report Status PENDING  Incomplete  Blood Culture (routine x 2)     Status: None (Preliminary result)   Collection Time: 02/03/20  6:27 PM   Specimen: Right Antecubital; Blood  Result Value Ref  Range Status   Specimen Description RIGHT ANTECUBITAL  Final   Special Requests   Final    BOTTLES DRAWN AEROBIC AND ANAEROBIC Blood Culture adequate volume   Culture   Final    NO GROWTH 2 DAYS Performed at [facility], [address] [place], [place] [address]    Report Status PENDING  Incomplete  Respiratory Panel by PCR     Status: Abnormal   Collection Time: 02/03/20 10:48 PM   Specimen: Nasopharyngeal Swab; Respiratory  Result Value Ref Range Status   Adenovirus NOT DETECTED NOT DETECTED Final   Coronavirus 229E NOT DETECTED NOT DETECTED Final    Comment: (NOTE) The Coronavirus on the Respiratory Panel, DOES NOT test for the novel  Coronavirus (2019 nCoV)    Coronavirus HKU1 NOT DETECTED NOT DETECTED Final   Coronavirus NL63 NOT DETECTED NOT DETECTED Final   Coronavirus OC43 NOT DETECTED NOT DETECTED Final   Metapneumovirus NOT DETECTED NOT DETECTED Final   Rhinovirus / Enterovirus DETECTED (A) NOT DETECTED Final   Influenza A NOT DETECTED NOT DETECTED Final   Influenza B NOT DETECTED  NOT DETECTED Final   Parainfluenza Virus 1 NOT DETECTED NOT DETECTED Final   Parainfluenza Virus 2 NOT DETECTED NOT DETECTED Final   Parainfluenza Virus 3 NOT DETECTED NOT DETECTED Final   Parainfluenza Virus 4 NOT DETECTED NOT DETECTED Final   Respiratory Syncytial Virus NOT DETECTED NOT DETECTED Final   Bordetella pertussis NOT DETECTED NOT DETECTED Final   Chlamydophila pneumoniae NOT DETECTED NOT DETECTED Final   Mycoplasma pneumoniae NOT DETECTED NOT DETECTED Final    Comment: Performed at [facility], [place]. [address], [place], [place] [address]     Labs: BNP (last 3 results) No results for input(s): BNP in the last 8760 hours. Basic Metabolic Panel: Recent Labs  Lab 02/03/20 1822 02/04/20 0819  NA 135 135  K 3.7 4.3  CL 97* 99  CO2 28 27  GLUCOSE 72 205*  BUN 10 13  CREATININE 0.76 0.73  CALCIUM 8.8* 8.9  MG  --  2.4   Liver Function Tests: Recent Labs  Lab 02/03/20 1822 02/04/20 0819  AST 14* 13*  ALT 23 21  ALKPHOS 63 64  BILITOT 0.7 0.7  PROT 7.6 7.5  ALBUMIN 3.9 3.7   No results for input(s): LIPASE, AMYLASE in the last 168 hours. No results for input(s): AMMONIA in the last 168 hours. CBC: Recent Labs  Lab 02/03/20 1822 02/04/20 0819  WBC 17.7* 13.4*  NEUTROABS 12.4* 11.7*  HGB 14.1 14.2  HCT 44.8 44.3  MCV 90.9 90.8  PLT 334 329   Cardiac Enzymes: No results for input(s): CKTOTAL, CKMB, CKMBINDEX, TROPONINI in the last 168 hours. BNP: Invalid input(s): POCBNP CBG: Recent Labs  Lab 02/03/20 2052  GLUCAP 115*   D-Dimer Recent Labs    02/03/20 1822  DDIMER 0.46   Hgb A1c No results for input(s): HGBA1C in the last 72 hours. Lipid Profile Recent Labs    02/03/20 1822  TRIG 167*   Thyroid function studies No results for input(s): TSH, T4TOTAL, T3FREE, THYROIDAB in the last 72 hours.  Invalid input(s): FREET3 Anemia work up [employer]    02/03/20 1822  FERRITIN 163   Urinalysis No results found for: COLORURINE,  APPEARANCEUR, LABSPEC, [place], GLUCOSEU, [place], [place], [place], PROTEINUR, UROBILINOGEN, NITRITE, LEUKOCYTESUR Sepsis Labs Invalid input(s): PROCALCITONIN,  WBC,  LACTICIDVEN Microbiology Recent Results (from the past 240 hour(s))  SARS Coronavirus 2 by RT PCR (hospital order,  performed in [facility] hospital lab) Nasopharyngeal Nasopharyngeal Swab     Status: None   Collection Time: 02/03/20  6:22 PM   Specimen: Nasopharyngeal Swab  Result Value Ref Range Status   SARS Coronavirus 2 NEGATIVE NEGATIVE Final    Comment: (NOTE) SARS-CoV-2 target nucleic acids are NOT DETECTED.  The SARS-CoV-2 RNA is generally detectable in upper and lower respiratory specimens during the acute phase of infection. The lowest concentration of SARS-CoV-2 viral copies this assay can detect is 250 copies / mL. A negative result does not preclude SARS-CoV-2 infection and should not be used as the sole basis for treatment or other patient management decisions.  A negative result may occur with improper specimen collection / handling, submission of specimen other than nasopharyngeal swab, presence of viral mutation(s) within the areas targeted by this assay, and inadequate number of viral copies (<250 copies / mL). A negative result must be combined with clinical observations, patient history, and epidemiological information.  Fact Sheet for Patients:   StrictlyIdeas.no  Fact Sheet for Healthcare Providers: BankingDealers.co.za  This test is not yet approved or  cleared by the [place] FDA and has been authorized for detection and/or diagnosis of SARS-CoV-2 by FDA under an Emergency Use Authorization (EUA).  This EUA will remain in effect (meaning this test can be used) for the duration of the COVID-19 declaration under Section 564(b)(1) of the Act, 21 U.S.C. section 360bbb-3(b)(1), unless the authorization is terminated or revoked  sooner.  Performed at [facility], [address], [place], [place] [address]   Blood Culture (routine x 2)     Status: None (Preliminary result)   Collection Time: 02/03/20  6:22 PM   Specimen: Left Antecubital; Blood  Result Value Ref Range Status   Specimen Description LEFT ANTECUBITAL  Final   Special Requests   Final    BOTTLES DRAWN AEROBIC AND ANAEROBIC Blood Culture adequate volume   Culture   Final    NO GROWTH 2 DAYS Performed at [facility], [address], [place], [place] [address]    Report Status PENDING  Incomplete  Blood Culture (routine x 2)     Status: None (Preliminary result)   Collection Time: 02/03/20  6:27 PM   Specimen: Right Antecubital; Blood  Result Value Ref Range Status   Specimen Description RIGHT ANTECUBITAL  Final   Special Requests   Final    BOTTLES DRAWN AEROBIC AND ANAEROBIC Blood Culture adequate volume   Culture   Final    NO GROWTH 2 DAYS Performed at [facility], [address], [place], [place] [address]    Report Status PENDING  Incomplete  Respiratory Panel by PCR     Status: Abnormal   Collection Time: 02/03/20 10:48 PM   Specimen: Nasopharyngeal Swab; Respiratory  Result Value Ref Range Status   Adenovirus NOT DETECTED NOT DETECTED Final   Coronavirus 229E NOT DETECTED NOT DETECTED Final    Comment: (NOTE) The Coronavirus on the Respiratory Panel, DOES NOT test for the novel  Coronavirus (2019 nCoV)    Coronavirus HKU1 NOT DETECTED NOT DETECTED Final   Coronavirus NL63 NOT DETECTED NOT DETECTED Final   Coronavirus OC43 NOT DETECTED NOT DETECTED Final   Metapneumovirus NOT DETECTED NOT DETECTED Final   Rhinovirus / Enterovirus DETECTED (A) NOT DETECTED Final   Influenza A NOT DETECTED NOT DETECTED Final   Influenza B NOT DETECTED NOT DETECTED Final   Parainfluenza Virus 1 NOT DETECTED NOT DETECTED Final   Parainfluenza Virus 2  NOT DETECTED NOT DETECTED Final   Parainfluenza Virus 3 NOT DETECTED NOT DETECTED Final    Parainfluenza Virus 4 NOT DETECTED NOT DETECTED Final   Respiratory Syncytial Virus NOT DETECTED NOT DETECTED Final   Bordetella pertussis NOT DETECTED NOT DETECTED Final   Chlamydophila pneumoniae NOT DETECTED NOT DETECTED Final   Mycoplasma pneumoniae NOT DETECTED NOT DETECTED Final    Comment: Performed at [facility], [place] [address] [place], [place] [address]     Time coordinating discharge: 35 minutes  SIGNED:   Rodena Goldmann, DO [facility] 02/05/2020, 11:57 AM  If 7PM-7AM, please contact night-coverage [email or web address]

## 2020-02-08 LAB — CULTURE, BLOOD (ROUTINE X 2)
Culture: NO GROWTH
Culture: NO GROWTH
Special Requests: ADEQUATE
Special Requests: ADEQUATE

## 2022-04-11 IMAGING — CT CT ANGIO CHEST
2 of 6 series · 17 of 46 positions shown · IV contrast (Omnipaque or Isovue)
Comparison: Chest x-ray from earlier in the same day.

CLINICAL DATA: Shortness of breath for 2 weeks with productive
cough

EXAM:
CT ANGIOGRAPHY CHEST WITH CONTRAST
TECHNIQUE: Multidetector CT imaging of the chest was performed using the
standard protocol during bolus administration of intravenous
contrast. Multiplanar CT image reconstructions and MIPs were
obtained to evaluate the vascular anatomy.
CONTRAST:  100mL OMNIPAQUE IOHEXOL 350 MG/ML SOLN

[Series 5: pe axial thins · axial · 0.78mm/px · z∈[+1248,+1532]mm · 14 of 312 slices shown]
[im 14/312  lung]
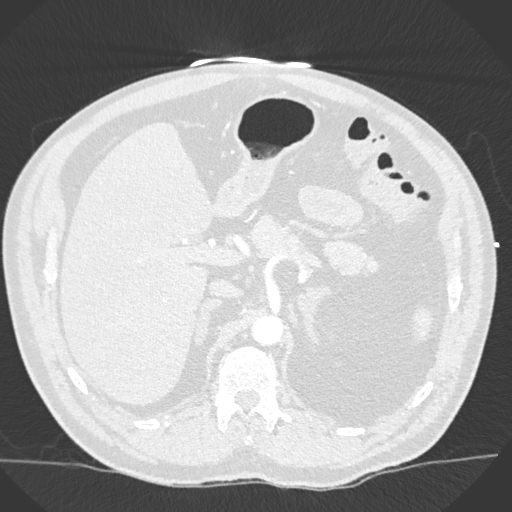
[im 41/312  soft-tissue]
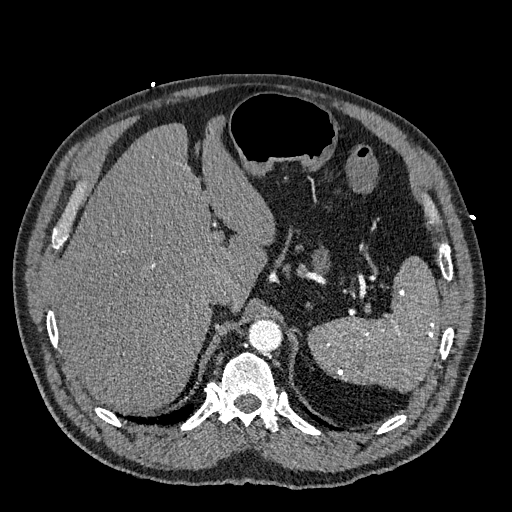
[im 55/312  lung]
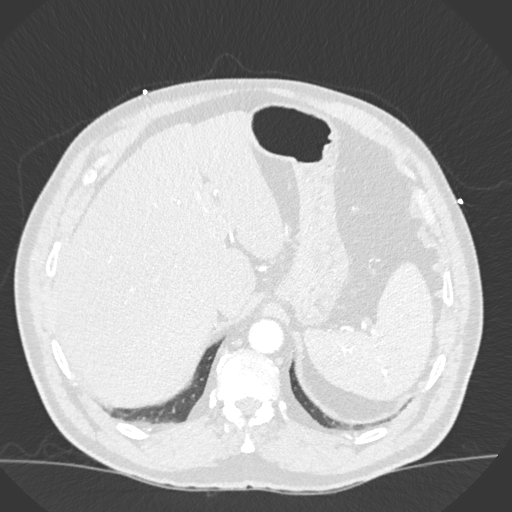
[im 82/312  soft-tissue]
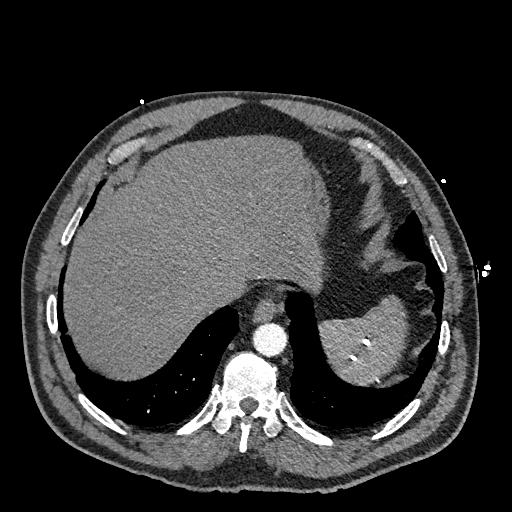
[im 109/312  lung]
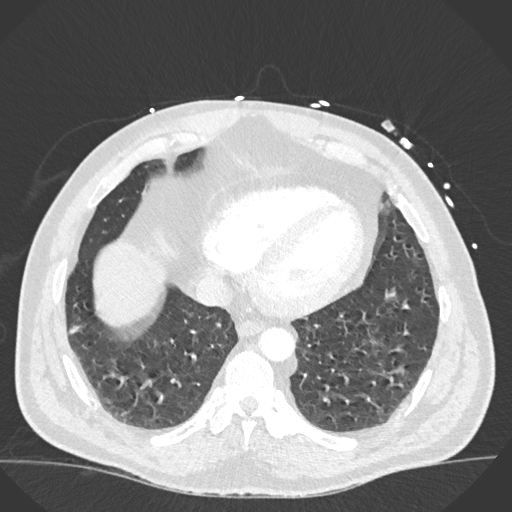
[im 122/312  soft-tissue]
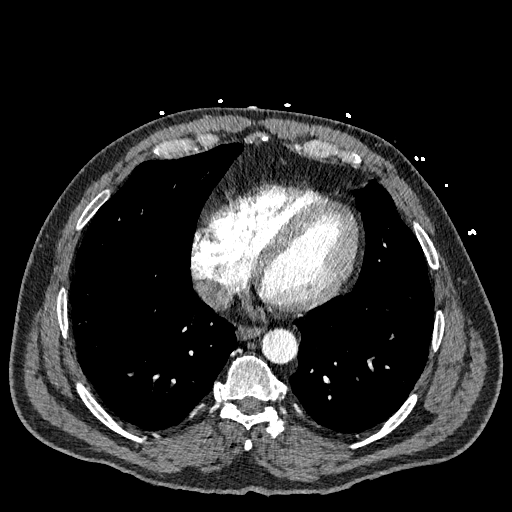
[im 149/312  lung]
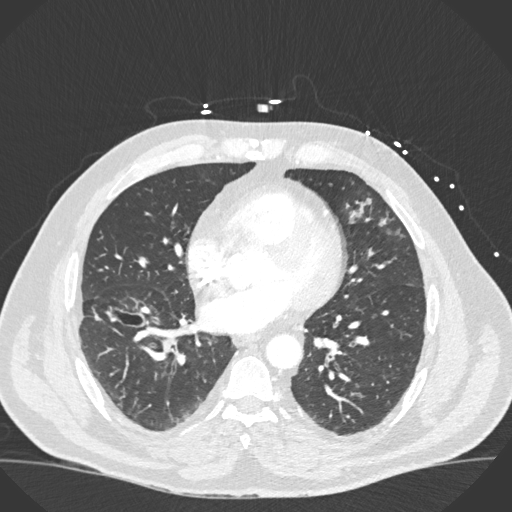
[im 163/312  soft-tissue]
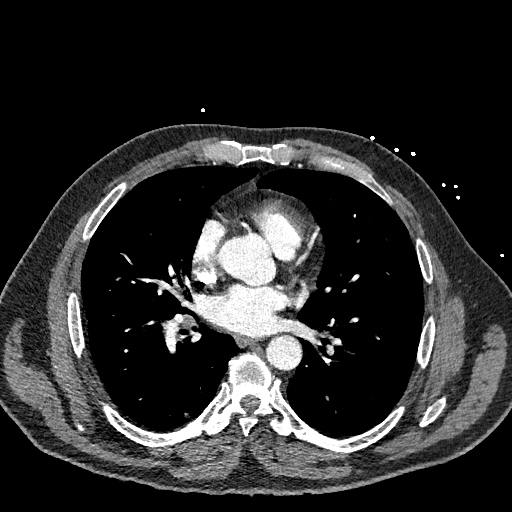
[im 190/312  lung]
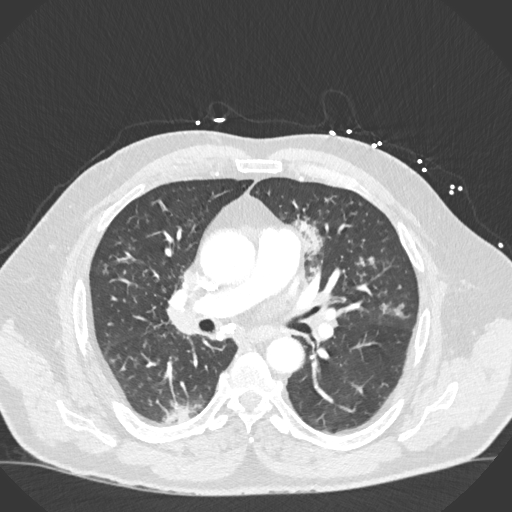
[im 203/312  soft-tissue]
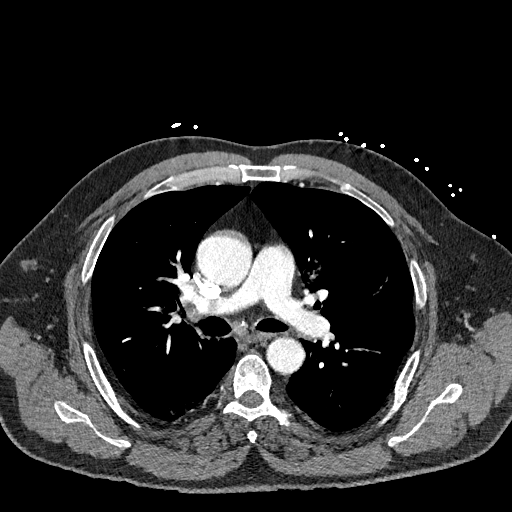
[im 230/312  lung]
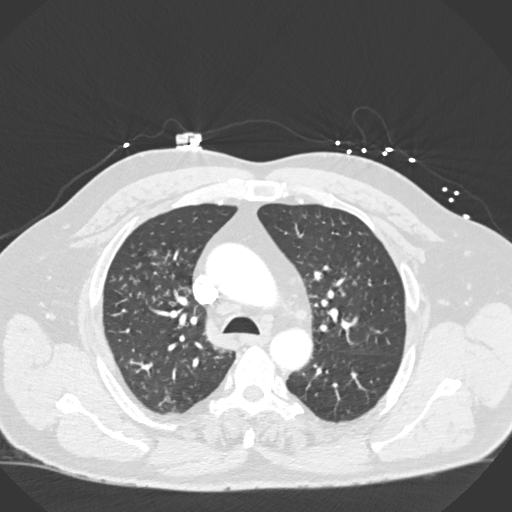
[im 257/312  soft-tissue]
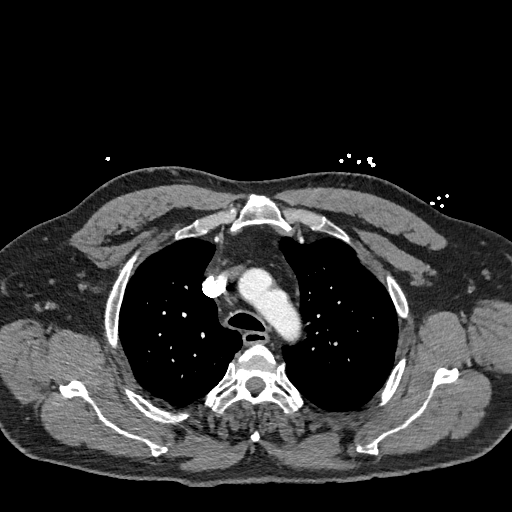
[im 271/312  lung]
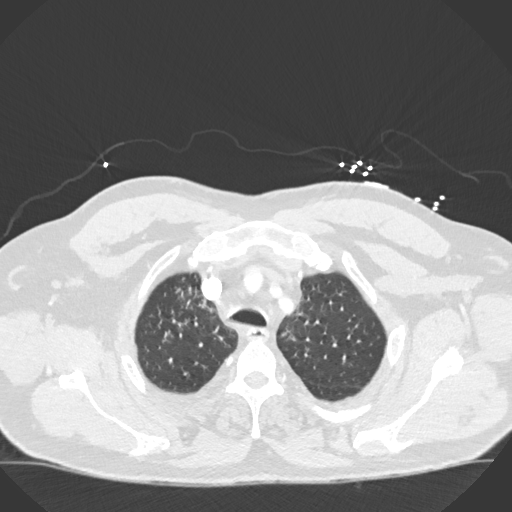
[im 298/312  soft-tissue]
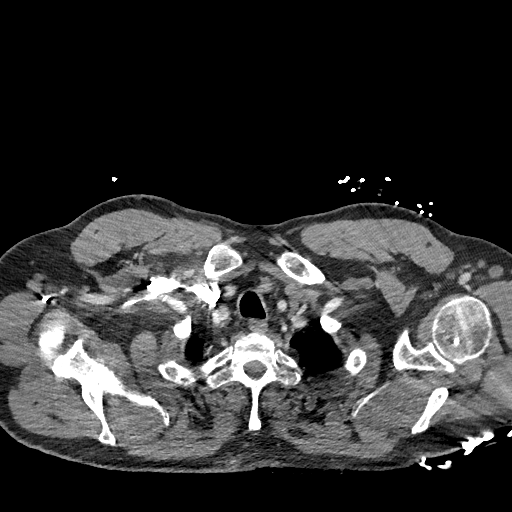

[Series 8: cor soft · coronal · 0.60mm/px · 3 of 151 slices shown]
[im 38/151  soft-tissue]
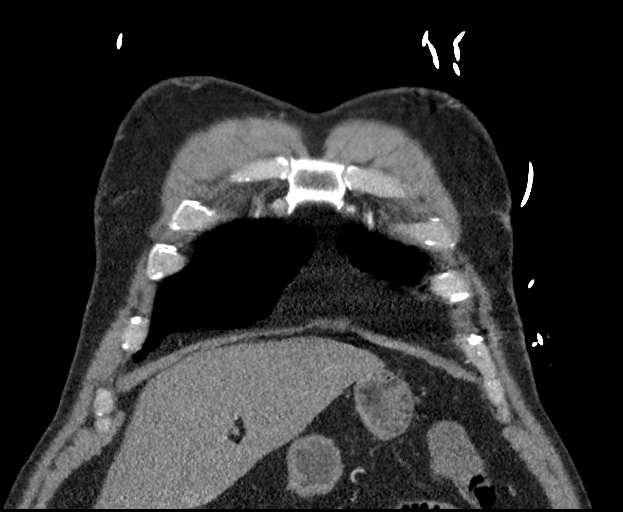
[im 76/151  soft-tissue]
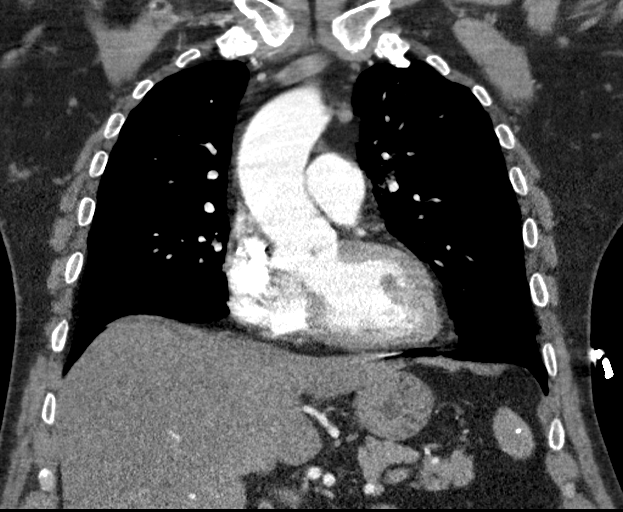
[im 113/151  soft-tissue]
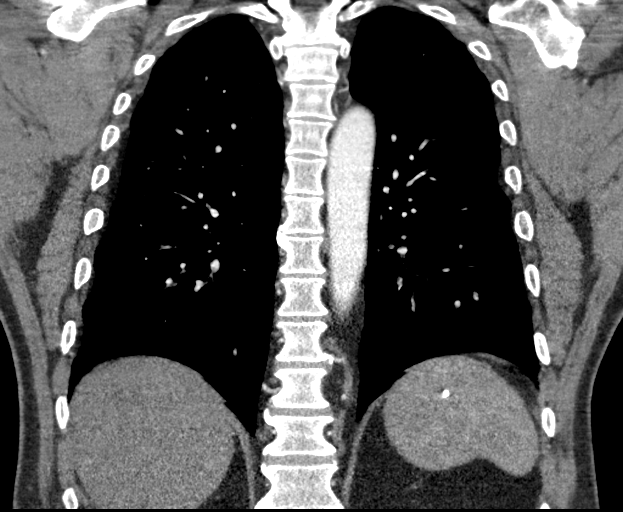

[17 of 46 positions shown; findings below may reference images not displayed]

FINDINGS: Cardiovascular: Thoracic aorta demonstrates a normal branching
pattern. Mild dilatation of the ascending aorta to 4.1 cm is noted.
No dissection is seen. No cardiac enlargement is noted. The
pulmonary artery is well visualized with a normal branching pattern.
No definitive filling defect is identified to suggest pulmonary
embolism.

Mediastinum/Nodes: The thoracic inlet is within normal limits.
Scattered small mediastinal lymph nodes are noted although not
significant by size criteria. Some of these are calcified consistent
with prior granulomatous disease. There are hilar lymph nodes
identified bilaterally right slightly greater than left also with
some degree of calcification consistent with prior granulomatous
disease. The esophagus as visualized is within normal limits.

Lungs/Pleura: The lungs are well aerated bilaterally. Diffuse
tree-in-bud opacities are noted particularly in the upper lobes
bilaterally consistent with atypical pneumonia. These changes could
be related to underlying sarcoid given granulomatous changes. No
sizable effusion is noted. No confluent infiltrate is seen.

Upper Abdomen: Multiple calcified splenic granulomas are noted. The
remainder of the upper abdomen appears within normal limits.

Musculoskeletal: Degenerative changes of the thoracic spine without
acute bony abnormality.

Review of the MIP images confirms the above findings.
IMPRESSION: Changes consistent with prior granulomatous disease as described.

Tree-in-bud opacities primarily within the upper lobes bilaterally
consistent with atypical pneumonia. This could represent underlying
sarcoid. The need for further workup can be determined on a clinical
basis.

No evidence of pulmonary emboli.

Dilatation of the ascending aorta to 4.1 cm. Recommend annual
imaging followup by CTA or MRA. This recommendation follows 5636
ACCF/AHA/AATS/ACR/ASA/SCA/MAIRIM/KOHLI/TUSHAR/ALLANYS Guidelines for the
Diagnosis and Management of Patients with Thoracic Aortic Disease.
Circulation. 5636; 121: [id number]. Aortic aneurysm NOS (OVPQM-MP6.E)

Aortic Atherosclerosis ([id number].R).

## 2023-05-23 ENCOUNTER — Encounter (INDEPENDENT_AMBULATORY_CARE_PROVIDER_SITE_OTHER): Payer: Self-pay | Admitting: *Deleted

## 2023-07-19 ENCOUNTER — Institutional Professional Consult (permissible substitution): Payer: BC Managed Care – PPO | Admitting: Pulmonary Disease

## 2023-11-20 ENCOUNTER — Encounter (INDEPENDENT_AMBULATORY_CARE_PROVIDER_SITE_OTHER): Payer: Self-pay | Admitting: *Deleted
